# Patient Record
Sex: Male | Born: 1935 | Race: White | Hispanic: No | Marital: Married | Smoking: Former smoker
Health system: Southern US, Community
[De-identification: ages and names within clinical notes are randomized; demographics above are authoritative.]

## PROBLEM LIST (undated history)

## (undated) DIAGNOSIS — Q211 Atrial septal defect: Secondary | ICD-10-CM

## (undated) DIAGNOSIS — I2510 Atherosclerotic heart disease of native coronary artery without angina pectoris: Secondary | ICD-10-CM

## (undated) DIAGNOSIS — E0590 Thyrotoxicosis, unspecified without thyrotoxic crisis or storm: Secondary | ICD-10-CM

## (undated) DIAGNOSIS — I513 Intracardiac thrombosis, not elsewhere classified: Secondary | ICD-10-CM

## (undated) DIAGNOSIS — F419 Anxiety disorder, unspecified: Secondary | ICD-10-CM

## (undated) DIAGNOSIS — Q2112 Patent foramen ovale: Secondary | ICD-10-CM

## (undated) DIAGNOSIS — E119 Type 2 diabetes mellitus without complications: Secondary | ICD-10-CM

## (undated) DIAGNOSIS — I472 Ventricular tachycardia, unspecified: Secondary | ICD-10-CM

## (undated) DIAGNOSIS — I493 Ventricular premature depolarization: Secondary | ICD-10-CM

## (undated) DIAGNOSIS — C679 Malignant neoplasm of bladder, unspecified: Secondary | ICD-10-CM

## (undated) DIAGNOSIS — E785 Hyperlipidemia, unspecified: Secondary | ICD-10-CM

## (undated) DIAGNOSIS — I639 Cerebral infarction, unspecified: Secondary | ICD-10-CM

## (undated) DIAGNOSIS — I219 Acute myocardial infarction, unspecified: Secondary | ICD-10-CM

## (undated) DIAGNOSIS — I4891 Unspecified atrial fibrillation: Secondary | ICD-10-CM

## (undated) DIAGNOSIS — I519 Heart disease, unspecified: Secondary | ICD-10-CM

## (undated) DIAGNOSIS — IMO0002 Reserved for concepts with insufficient information to code with codable children: Secondary | ICD-10-CM

## (undated) DIAGNOSIS — N399 Disorder of urinary system, unspecified: Secondary | ICD-10-CM

## (undated) DIAGNOSIS — I10 Essential (primary) hypertension: Secondary | ICD-10-CM

## (undated) DIAGNOSIS — I4819 Other persistent atrial fibrillation: Secondary | ICD-10-CM

## (undated) DIAGNOSIS — R002 Palpitations: Secondary | ICD-10-CM

## (undated) HISTORY — DX: Intracardiac thrombosis, not elsewhere classified: I51.3

## (undated) HISTORY — DX: Ventricular premature depolarization: I49.3

## (undated) HISTORY — DX: Atherosclerotic heart disease of native coronary artery without angina pectoris: I25.10

## (undated) HISTORY — DX: Reserved for concepts with insufficient information to code with codable children: IMO0002

## (undated) HISTORY — PX: PROSTATECTOMY: SHX69

## (undated) HISTORY — DX: Essential (primary) hypertension: I10

## (undated) HISTORY — DX: Palpitations: R00.2

## (undated) HISTORY — PX: CARDIAC CATHETERIZATION: SHX172

## (undated) HISTORY — DX: Malignant neoplasm of bladder, unspecified: C67.9

## (undated) HISTORY — DX: Patent foramen ovale: Q21.12

## (undated) HISTORY — DX: Acute myocardial infarction, unspecified: I21.9

## (undated) HISTORY — DX: Heart disease, unspecified: I51.9

## (undated) HISTORY — DX: Hyperlipidemia, unspecified: E78.5

## (undated) HISTORY — DX: Ventricular tachycardia: I47.2

## (undated) HISTORY — DX: Anxiety disorder, unspecified: F41.9

## (undated) HISTORY — DX: Unspecified atrial fibrillation: I48.91

## (undated) HISTORY — DX: Atrial septal defect: Q21.1

## (undated) HISTORY — DX: Thyrotoxicosis, unspecified without thyrotoxic crisis or storm: E05.90

## (undated) HISTORY — DX: Cerebral infarction, unspecified: I63.9

## (undated) HISTORY — DX: Ventricular tachycardia, unspecified: I47.20

## (undated) HISTORY — DX: Disorder of urinary system, unspecified: N39.9

---
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
## 1990-05-09 HISTORY — PX: CYSTECTOMY: SUR359

## 1990-05-09 HISTORY — PX: NEPHRECTOMY: SHX65

## 1993-05-09 DIAGNOSIS — I219 Acute myocardial infarction, unspecified: Secondary | ICD-10-CM

## 1993-05-09 HISTORY — DX: Acute myocardial infarction, unspecified: I21.9

## 1997-08-28 ENCOUNTER — Ambulatory Visit (HOSPITAL_COMMUNITY): Admission: RE | Admit: 1997-08-28 | Discharge: 1997-08-28 | Payer: Self-pay | Admitting: Urology

## 1997-10-15 ENCOUNTER — Ambulatory Visit (HOSPITAL_COMMUNITY): Admission: RE | Admit: 1997-10-15 | Discharge: 1997-10-15 | Payer: Self-pay | Admitting: Urology

## 1997-10-22 ENCOUNTER — Ambulatory Visit (HOSPITAL_COMMUNITY): Admission: RE | Admit: 1997-10-22 | Discharge: 1997-10-22 | Payer: Self-pay | Admitting: Urology

## 1997-12-10 ENCOUNTER — Ambulatory Visit (HOSPITAL_COMMUNITY): Admission: RE | Admit: 1997-12-10 | Discharge: 1997-12-10 | Payer: Self-pay | Admitting: Gastroenterology

## 1998-05-09 HISTORY — PX: EYE SURGERY: SHX253

## 1998-06-09 ENCOUNTER — Ambulatory Visit (HOSPITAL_COMMUNITY): Admission: RE | Admit: 1998-06-09 | Discharge: 1998-06-09 | Payer: Self-pay | Admitting: Ophthalmology

## 1998-12-08 ENCOUNTER — Ambulatory Visit (HOSPITAL_COMMUNITY): Admission: RE | Admit: 1998-12-08 | Discharge: 1998-12-08 | Payer: Self-pay | Admitting: Ophthalmology

## 2000-12-05 ENCOUNTER — Ambulatory Visit (HOSPITAL_COMMUNITY): Admission: RE | Admit: 2000-12-05 | Discharge: 2000-12-05 | Payer: Self-pay | Admitting: Gastroenterology

## 2000-12-05 ENCOUNTER — Encounter (INDEPENDENT_AMBULATORY_CARE_PROVIDER_SITE_OTHER): Payer: Self-pay | Admitting: Specialist

## 2002-02-08 ENCOUNTER — Encounter: Admission: RE | Admit: 2002-02-08 | Discharge: 2002-02-08 | Payer: Self-pay | Admitting: Urology

## 2002-02-08 ENCOUNTER — Encounter: Payer: Self-pay | Admitting: Urology

## 2002-02-12 ENCOUNTER — Encounter (INDEPENDENT_AMBULATORY_CARE_PROVIDER_SITE_OTHER): Payer: Self-pay

## 2002-02-12 ENCOUNTER — Ambulatory Visit (HOSPITAL_BASED_OUTPATIENT_CLINIC_OR_DEPARTMENT_OTHER): Admission: RE | Admit: 2002-02-12 | Discharge: 2002-02-12 | Payer: Self-pay | Admitting: Urology

## 2002-05-06 ENCOUNTER — Emergency Department (HOSPITAL_COMMUNITY): Admission: EM | Admit: 2002-05-06 | Discharge: 2002-05-07 | Payer: Self-pay | Admitting: Physical Therapy

## 2002-09-06 ENCOUNTER — Ambulatory Visit (HOSPITAL_BASED_OUTPATIENT_CLINIC_OR_DEPARTMENT_OTHER): Admission: RE | Admit: 2002-09-06 | Discharge: 2002-09-06 | Payer: Self-pay | Admitting: Urology

## 2002-09-06 ENCOUNTER — Encounter (INDEPENDENT_AMBULATORY_CARE_PROVIDER_SITE_OTHER): Payer: Self-pay | Admitting: *Deleted

## 2004-05-09 DIAGNOSIS — I639 Cerebral infarction, unspecified: Secondary | ICD-10-CM

## 2004-05-09 HISTORY — DX: Cerebral infarction, unspecified: I63.9

## 2004-08-18 ENCOUNTER — Inpatient Hospital Stay (HOSPITAL_COMMUNITY): Admission: EM | Admit: 2004-08-18 | Discharge: 2004-08-22 | Payer: Self-pay | Admitting: Emergency Medicine

## 2004-08-19 ENCOUNTER — Encounter (INDEPENDENT_AMBULATORY_CARE_PROVIDER_SITE_OTHER): Payer: Self-pay | Admitting: *Deleted

## 2005-05-23 ENCOUNTER — Ambulatory Visit: Payer: Self-pay | Admitting: Internal Medicine

## 2005-05-24 ENCOUNTER — Encounter (INDEPENDENT_AMBULATORY_CARE_PROVIDER_SITE_OTHER): Payer: Self-pay | Admitting: *Deleted

## 2005-05-24 ENCOUNTER — Inpatient Hospital Stay (HOSPITAL_COMMUNITY): Admission: EM | Admit: 2005-05-24 | Discharge: 2005-05-27 | Payer: Self-pay | Admitting: Emergency Medicine

## 2005-05-25 HISTORY — PX: CARDIAC DEFIBRILLATOR PLACEMENT: SHX171

## 2005-06-08 ENCOUNTER — Ambulatory Visit: Payer: Self-pay

## 2005-07-19 ENCOUNTER — Inpatient Hospital Stay (HOSPITAL_COMMUNITY): Admission: RE | Admit: 2005-07-19 | Discharge: 2005-07-21 | Payer: Self-pay | Admitting: Urology

## 2005-07-20 ENCOUNTER — Encounter (INDEPENDENT_AMBULATORY_CARE_PROVIDER_SITE_OTHER): Payer: Self-pay | Admitting: *Deleted

## 2005-09-17 ENCOUNTER — Inpatient Hospital Stay (HOSPITAL_COMMUNITY): Admission: EM | Admit: 2005-09-17 | Discharge: 2005-09-29 | Payer: Self-pay | Admitting: Emergency Medicine

## 2005-09-17 ENCOUNTER — Ambulatory Visit: Payer: Self-pay | Admitting: Internal Medicine

## 2005-09-19 ENCOUNTER — Encounter (INDEPENDENT_AMBULATORY_CARE_PROVIDER_SITE_OTHER): Payer: Self-pay | Admitting: *Deleted

## 2005-10-26 ENCOUNTER — Ambulatory Visit: Payer: Self-pay | Admitting: Internal Medicine

## 2005-11-13 ENCOUNTER — Emergency Department (HOSPITAL_COMMUNITY): Admission: EM | Admit: 2005-11-13 | Discharge: 2005-11-13 | Payer: Self-pay | Admitting: *Deleted

## 2005-11-29 ENCOUNTER — Inpatient Hospital Stay (HOSPITAL_COMMUNITY): Admission: RE | Admit: 2005-11-29 | Discharge: 2005-11-30 | Payer: Self-pay | Admitting: Urology

## 2005-11-30 ENCOUNTER — Encounter (INDEPENDENT_AMBULATORY_CARE_PROVIDER_SITE_OTHER): Payer: Self-pay | Admitting: *Deleted

## 2008-05-14 ENCOUNTER — Emergency Department (HOSPITAL_COMMUNITY): Admission: EM | Admit: 2008-05-14 | Discharge: 2008-05-14 | Payer: Self-pay | Admitting: Emergency Medicine

## 2008-06-16 ENCOUNTER — Inpatient Hospital Stay (HOSPITAL_COMMUNITY): Admission: AD | Admit: 2008-06-16 | Discharge: 2008-06-19 | Payer: Self-pay | Admitting: *Deleted

## 2008-06-23 ENCOUNTER — Encounter: Admission: RE | Admit: 2008-06-23 | Discharge: 2008-06-23 | Payer: Self-pay | Admitting: Internal Medicine

## 2009-08-19 LAB — ICD DEVICE OBSERVATION

## 2009-12-29 ENCOUNTER — Ambulatory Visit: Payer: Self-pay | Admitting: *Deleted

## 2010-01-19 ENCOUNTER — Ambulatory Visit: Payer: Self-pay | Admitting: Cardiology

## 2010-01-19 LAB — POCT INR: INR: 2.3

## 2010-02-01 ENCOUNTER — Ambulatory Visit: Payer: Self-pay | Admitting: Cardiology

## 2010-02-01 ENCOUNTER — Ambulatory Visit (HOSPITAL_COMMUNITY): Admission: RE | Admit: 2010-02-01 | Discharge: 2010-02-01 | Payer: Self-pay | Admitting: Cardiology

## 2010-02-10 ENCOUNTER — Encounter: Admission: RE | Admit: 2010-02-10 | Discharge: 2010-02-10 | Payer: Self-pay | Admitting: Cardiology

## 2010-03-01 ENCOUNTER — Ambulatory Visit: Payer: Self-pay | Admitting: Cardiology

## 2010-03-02 ENCOUNTER — Encounter: Payer: Self-pay | Admitting: Internal Medicine

## 2010-03-18 ENCOUNTER — Ambulatory Visit: Payer: Self-pay | Admitting: Internal Medicine

## 2010-03-29 ENCOUNTER — Ambulatory Visit: Payer: Self-pay | Admitting: Cardiology

## 2010-04-14 ENCOUNTER — Ambulatory Visit: Payer: Self-pay | Admitting: Cardiology

## 2010-05-13 ENCOUNTER — Ambulatory Visit: Payer: Self-pay | Admitting: Cardiology

## 2010-06-09 ENCOUNTER — Other Ambulatory Visit (INDEPENDENT_AMBULATORY_CARE_PROVIDER_SITE_OTHER): Payer: Medicare Other

## 2010-06-09 DIAGNOSIS — Z7901 Long term (current) use of anticoagulants: Secondary | ICD-10-CM

## 2010-06-10 NOTE — Miscellaneous (Signed)
Summary: Device preload  Clinical Lists Changes  Observations: Added new observation of ICD INDICATN: VT (03/02/2010 16:17) Added new observation of ICDLEADSTAT1: active (03/02/2010 16:17) Added new observation of ICDLEADSER1: [id number] (03/02/2010 16:17) Added new observation of ICDLEADMOD1: 7001  (03/02/2010 16:17) Added new observation of ICDLEADDOI1: 05/25/2005  (03/02/2010 16:17) Added new observation of ICDLEADLOC1: RV  (03/02/2010 16:17) Added new observation of ICD IMP MD: Lewayne Bunting, MD  (03/02/2010 16:17) Added new observation of ICD IMPL DTE: 05/25/2005  (03/02/2010 16:17) Added new observation of ICD MODL#: V168  (03/02/2010 16:17) Added new observation of ICDMANUFACTR: [employer] Jude  (03/02/2010 16:17) Added new observation of IDC REFER MD: Gloris Ham Tennant,MD  (03/02/2010 16:17) Added new observation of ICD MD: Lewayne Bunting, MD  (03/02/2010 16:17)       ICD Specifications Following MD:  Lewayne Bunting, MD     Referring MD:  Burna Forts ICD Vendor:  [employer] Jude     ICD Model Number:  [phone]     ICD DOI:  05/25/2005     ICD Implanting MD:  Lewayne Bunting, MD  Lead 1:    Location: RV     DOI: 05/25/2005     Model #: [id number]     Serial #: [id number]     Status: active  Indications::  VT

## 2010-06-10 NOTE — Procedures (Signed)
Summary: DEFIB CK   Current Medications (verified): 1)  Amlodipine Besylate 5 Mg Tabs (Amlodipine Besylate) .... One By Mouth Two Times A Day 2)  Aspir-Low 81 Mg Tbec (Aspirin) .... One By Mouth Daily 3)  Furosemide 40 Mg Tabs (Furosemide) .... One By Mouth Daily 4)  Lisinopril 20 Mg Tabs (Lisinopril) .... One By Mouth Daily 5)  Warfarin Sodium 5 Mg Tabs (Warfarin Sodium) .... As Directed 6)  Sorine 160 Mg Tabs (Sotalol Hcl) .... One By Mouth Two Times A Day 7)  Simvastatin 40 Mg Tabs (Simvastatin) .... One By Mouth Daily  Allergies (verified): 1)  ! Penicillin   ICD Specifications Following MD:  Lewayne Bunting, MD     Referring MD:  Burna Forts ICD Vendor:  [facility] Jude     ICD Model Number:  [id number]     ICD DOI:  05/25/2005     ICD Implanting MD:  Lewayne Bunting, MD  Lead 1:    Location: RV     DOI: 05/25/2005     Model #: [id number]     Serial #: [id number]     Status: active  Indications::  VT   ICD Follow Up Remote Check?  No Battery Voltage:  2.61 V     Charge Time:  11.8 seconds     Underlying rhythm:  SR ICD Dependent:  No       ICD Device Measurements Right Ventricle:  Amplitude: 12 mV, Impedance: 550 ohms, Threshold: 0.5 V at 0.5 msec  Episodes Coumadin:  Yes Shock:  0     ATP:  2     Nonsustained:  0     Ventricular Pacing:  <1%  Brady Parameters Mode VVI     Lower Rate Limit:  45      Tachy Zones VF:  240     VT:  190     VT1:  130     Next Cardiology Appt Due:  06/09/2010 Tech Comments:  No pararmeter changes.  Device function normal.  1 VT episode ATP x2 successful 05/01/2009.  ROV 3months with Dr. Ladona Ridgel. Altha Harm, LPN  March 18, 2010 11:45 AM  +

## 2010-06-12 ENCOUNTER — Encounter: Payer: Self-pay | Admitting: Cardiology

## 2010-06-12 DIAGNOSIS — I472 Ventricular tachycardia: Secondary | ICD-10-CM | POA: Insufficient documentation

## 2010-06-12 DIAGNOSIS — I10 Essential (primary) hypertension: Secondary | ICD-10-CM | POA: Insufficient documentation

## 2010-06-12 DIAGNOSIS — N399 Disorder of urinary system, unspecified: Secondary | ICD-10-CM | POA: Insufficient documentation

## 2010-06-12 DIAGNOSIS — F419 Anxiety disorder, unspecified: Secondary | ICD-10-CM | POA: Insufficient documentation

## 2010-06-12 DIAGNOSIS — I4819 Other persistent atrial fibrillation: Secondary | ICD-10-CM | POA: Insufficient documentation

## 2010-06-12 DIAGNOSIS — E785 Hyperlipidemia, unspecified: Secondary | ICD-10-CM | POA: Insufficient documentation

## 2010-06-12 DIAGNOSIS — I219 Acute myocardial infarction, unspecified: Secondary | ICD-10-CM | POA: Insufficient documentation

## 2010-06-12 DIAGNOSIS — I2510 Atherosclerotic heart disease of native coronary artery without angina pectoris: Secondary | ICD-10-CM | POA: Insufficient documentation

## 2010-06-12 DIAGNOSIS — R002 Palpitations: Secondary | ICD-10-CM | POA: Insufficient documentation

## 2010-06-12 DIAGNOSIS — C679 Malignant neoplasm of bladder, unspecified: Secondary | ICD-10-CM | POA: Insufficient documentation

## 2010-06-12 DIAGNOSIS — E0590 Thyrotoxicosis, unspecified without thyrotoxic crisis or storm: Secondary | ICD-10-CM | POA: Insufficient documentation

## 2010-06-12 DIAGNOSIS — I493 Ventricular premature depolarization: Secondary | ICD-10-CM | POA: Insufficient documentation

## 2010-06-12 DIAGNOSIS — IMO0002 Reserved for concepts with insufficient information to code with codable children: Secondary | ICD-10-CM | POA: Insufficient documentation

## 2010-06-12 DIAGNOSIS — I513 Intracardiac thrombosis, not elsewhere classified: Secondary | ICD-10-CM | POA: Insufficient documentation

## 2010-06-12 DIAGNOSIS — Q211 Atrial septal defect: Secondary | ICD-10-CM | POA: Insufficient documentation

## 2010-06-28 DIAGNOSIS — I2510 Atherosclerotic heart disease of native coronary artery without angina pectoris: Secondary | ICD-10-CM | POA: Insufficient documentation

## 2010-06-28 DIAGNOSIS — Z95810 Presence of automatic (implantable) cardiac defibrillator: Secondary | ICD-10-CM | POA: Insufficient documentation

## 2010-06-28 DIAGNOSIS — E0590 Thyrotoxicosis, unspecified without thyrotoxic crisis or storm: Secondary | ICD-10-CM | POA: Insufficient documentation

## 2010-06-29 ENCOUNTER — Encounter (INDEPENDENT_AMBULATORY_CARE_PROVIDER_SITE_OTHER): Payer: Medicare Other | Admitting: Internal Medicine

## 2010-06-29 ENCOUNTER — Encounter: Payer: Self-pay | Admitting: Internal Medicine

## 2010-06-29 DIAGNOSIS — I472 Ventricular tachycardia, unspecified: Secondary | ICD-10-CM | POA: Insufficient documentation

## 2010-06-29 DIAGNOSIS — I2589 Other forms of chronic ischemic heart disease: Secondary | ICD-10-CM

## 2010-07-06 NOTE — Assessment & Plan Note (Signed)
Summary: pc2   Visit Type:  follow-up Primary Provider:  ehinger   History of Present Illness: [name] [name] returns today for followup to re-establish ICD followup. He has a h/o VT, and is s/p anterior MI. He has a h/o atrial fib. His ICD was implanted in 2007 after developing sustained VT. He had been followed by Dr. Reyes Ivan but now presents for ongoing evaluation. In the interim he has been well and denies recurrent ICD shocks. No c/p or sob. No peripheral edema.    Current Medications (verified): 1)  Amlodipine Besylate 5 Mg Tabs (Amlodipine Besylate) .... One By Mouth Two Times A Day 2)  Aspir-Low 81 Mg Tbec (Aspirin) .... One By Mouth Daily 3)  Furosemide 40 Mg Tabs (Furosemide) .... One By Mouth Daily 4)  Lisinopril 20 Mg Tabs (Lisinopril) .... One By Mouth Daily 5)  Warfarin Sodium 5 Mg Tabs (Warfarin Sodium) .... As Directed 6)  Sorine 160 Mg Tabs (Sotalol Hcl) .... One By Mouth Two Times A Day 7)  Simvastatin 40 Mg Tabs (Simvastatin) .... One By Mouth Daily  Allergies: 1)  ! Penicillin  Past History:  Past Medical History: Last updated: 06/28/2010 Current Problems:  IMPLANTATION OF DEFIBRILLATOR, HX OF (ICD-V45.02) LEFT VENTRICULAR FUNCTION, DECREASED (ICD-429.2) CAD (ICD-414.00) HYPERTHYROIDISM (ICD-242.90) ATRIAL FIBRILLATION (ICD-427.31)    Review of Systems  The patient denies chest pain, syncope, dyspnea on exertion, and peripheral edema.    Vital Signs:  Patient profile:   77 year old male Height:      73 inches Weight:      227 pounds BMI:     30.06 Pulse rate:   64 / minute BP sitting:   154 / 70  (left arm)  Vitals Entered By: Laurance Flatten CMA (June 29, 2010 1:49 PM)  Physical Exam  General:  Well developed, well nourished, in no acute distress.  HEENT: normal Neck: supple. No JVD. Carotids 2+ bilaterally no bruits Cor: RRR no rubs, gallops or murmur Lungs: CTA. Well healed ICD incision. Ab: soft, nontender. nondistended. No HSM. Good bowel  sounds Ext: warm. no cyanosis, clubbing or edema Neuro: alert and oriented. Grossly nonfocal. affect pleasant      ICD Specifications Following MD:  Lewayne Bunting, MD     Referring MD:  Burna Forts ICD Vendor:  [employer] Jude     ICD Model Number:  [phone]     ICD DOI:  05/25/2005     ICD Implanting MD:  Lewayne Bunting, MD  Lead 1:    Location: RV     DOI: 05/25/2005     Model #: 7001     Serial #: [id number]     Status: active  Indications::  VT   ICD Follow Up ICD Dependent:  No      Episodes Coumadin:  Yes  Brady Parameters Mode VVI     Lower Rate Limit:  45      Tachy Zones VF:  240     VT:  190     VT1:  130     MD Comments:  Normal device function.  Impression & Recommendations:  Problem # 1:  IMPLANTATION OF DEFIBRILLATOR, HX OF (ICD-V45.02) His device is working normally. Will follow.  Problem # 2:  ATRIAL FIBRILLATION (ICD-427.31) He appears to be maintaining NSR. Will continue sotalol. His updated medication list for this problem includes:    Aspir-low 81 Mg Tbec (Aspirin) ..... One by mouth daily    Warfarin Sodium 5 Mg Tabs (Warfarin sodium) .Marland KitchenMarland KitchenMarland KitchenMarland Kitchen  As directed    Sorine 160 Mg Tabs (Sotalol hcl) ..... One by mouth two times a day  Problem # 3:  VENTRICULAR TACHYCARDIA, PAROXYSMAL (ICD-427.1) His VT is well controlled on sotalol. Will continue current meds. His updated medication list for this problem includes:    Amlodipine Besylate 5 Mg Tabs (Amlodipine besylate) ..... One by mouth two times a day    Aspir-low 81 Mg Tbec (Aspirin) ..... One by mouth daily    Lisinopril 20 Mg Tabs (Lisinopril) ..... One by mouth daily    Warfarin Sodium 5 Mg Tabs (Warfarin sodium) .Marland Kitchen... As directed    Sorine 160 Mg Tabs (Sotalol hcl) ..... One by mouth two times a day  Patient Instructions: 1)  Your physician wants you to follow-up in: 3 months in the device clinic and 12 months with Dr Court Joy will receive a reminder letter in the mail two months in advance. If you don't  receive a letter, please call our office to schedule the follow-up appointment. 2)  Your physician recommends that you continue on your current medications as directed. Please refer to the Current Medication list given to you today.

## 2010-07-06 NOTE — Cardiovascular Report (Signed)
Summary: Office Visit   Office Visit   Imported By: Roderic Ovens 06/30/2010 15:58:26  _____________________________________________________________________  External Attachment:    Type:   Image     Comment:   External Document

## 2010-07-07 ENCOUNTER — Other Ambulatory Visit (INDEPENDENT_AMBULATORY_CARE_PROVIDER_SITE_OTHER): Payer: Medicare Other

## 2010-07-07 DIAGNOSIS — Z7901 Long term (current) use of anticoagulants: Secondary | ICD-10-CM

## 2010-07-07 DIAGNOSIS — I472 Ventricular tachycardia: Secondary | ICD-10-CM

## 2010-07-21 ENCOUNTER — Encounter (INDEPENDENT_AMBULATORY_CARE_PROVIDER_SITE_OTHER): Payer: Medicare Other

## 2010-07-21 DIAGNOSIS — Z7901 Long term (current) use of anticoagulants: Secondary | ICD-10-CM

## 2010-07-21 DIAGNOSIS — I4891 Unspecified atrial fibrillation: Secondary | ICD-10-CM

## 2010-08-02 ENCOUNTER — Ambulatory Visit (INDEPENDENT_AMBULATORY_CARE_PROVIDER_SITE_OTHER): Payer: Medicare Other | Admitting: Cardiology

## 2010-08-02 ENCOUNTER — Encounter: Payer: Self-pay | Admitting: Cardiology

## 2010-08-02 VITALS — BP 170/80 | HR 56 | Ht 73.0 in | Wt 226.0 lb

## 2010-08-02 DIAGNOSIS — I513 Intracardiac thrombosis, not elsewhere classified: Secondary | ICD-10-CM

## 2010-08-02 DIAGNOSIS — I2510 Atherosclerotic heart disease of native coronary artery without angina pectoris: Secondary | ICD-10-CM

## 2010-08-02 DIAGNOSIS — I10 Essential (primary) hypertension: Secondary | ICD-10-CM

## 2010-08-02 DIAGNOSIS — Z7901 Long term (current) use of anticoagulants: Secondary | ICD-10-CM

## 2010-08-02 DIAGNOSIS — I219 Acute myocardial infarction, unspecified: Secondary | ICD-10-CM

## 2010-08-02 LAB — POCT INR: INR: 3.4

## 2010-08-02 NOTE — Progress Notes (Signed)
Subjective:   [name] [name] comes in today for followup visit. In general, he's been doing reasonably well. 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  He's had previous cerebrovascular accident felt to be related to mural thrombus. He has only minimal residual right hand weakness from that event.  His last followup stress test was in June of 2009. 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  Current Outpatient Prescriptions  Medication Sig Dispense Refill  . amLODipine (NORVASC) 5 MG tablet Take 5 mg by mouth 2 (two) times daily.        Marland Kitchen aspirin 81 MG tablet Take 81 mg by mouth daily.        Marland Kitchen desoximetasone (TOPICORT) 0.05 % cream Apply topically as needed.        . DiphenhydrAMINE HCl (DIPHEN PO) Take by mouth as needed.        . furosemide (LASIX) 40 MG tablet Take 40 mg by mouth daily.        Marland Kitchen lisinopril (PRINIVIL,ZESTRIL) 20 MG tablet Take 20 mg by mouth daily.        . simvastatin (ZOCOR) 40 MG tablet Take 40 mg by mouth daily.        . sotalol (BETAPACE) 160 MG tablet Take 160 mg by mouth 2 (two) times daily.        Marland Kitchen warfarin (COUMADIN) 5 MG tablet Take 5 mg by mouth daily. AS DIRECTED.  2.5MG  FOUR DAYS A WEEK 5MG  THREE TIMES A WEEK       . fluticasone (FLONASE) 50 MCG/ACT nasal spray 1 spray by Nasal route as needed.          Allergies  Allergen Reactions  . Penicillins     Patient Active Problem List  Diagnoses  . PVCs (premature ventricular contractions)  . Heart palpitations  . Coronary artery disease  . Hypertension  . Atrial fibrillation  . Anxiety  . Myocardial infarction  . Ventricular tachycardia  . Bladder cancer  . Hyperthyroidism  . Patent foramen ovale  . Mural thrombus of heart  .  Genitourinary disease  . Hyperlipidemia  . HYPERTHYROIDISM  . CAD  . VENTRICULAR TACHYCARDIA, PAROXYSMAL  . LEFT VENTRICULAR FUNCTION, DECREASED  . IMPLANTATION OF DEFIBRILLATOR, HX OF    History  Smoking status  . Former Smoker  . Quit date: 05/09/1990  Smokeless tobacco  . Not on file    History  Alcohol Use No    Family History  Problem Relation Age of Onset  . Heart attack Father     Review of Systems:   The patient denies any heat or cold intolerance.  No weight gain or weight loss.  The patient denies headaches or blurry vision.  There is no cough or sputum production.  The patient denies dizziness.  There is no hematuria or hematochezia.  The patient denies any muscle aches or arthritis.  The patient denies any rash.  The patient denies frequent falling or instability.  There is no history of depression or anxiety.  All other systems were reviewed and are negative.   Physical Exam:  Weight is 226. Vital signs are reviewed. The head is normocephalic and atraumatic.  Pupils are equally round and reactive to light.  Sclerae nonicteric.  Conjunctiva is clear.  Oropharynx is unremarkable.  There's adequate oral airway.  Neck is supple there are no masses.  Thyroid is not enlarged.  There is no lymphadenopathy.  Lungs are clear.  Chest is symmetric.  Heart shows a regular rate and rhythm.  S1 and S2 are normal.  There is no murmur click or gallop.  Abdomen is soft normal bowel sounds.  There is no organomegaly.  Genital and rectal deferred.  Extremities are without edema.  Peripheral pulses are adequate.  Neurologically intact.  Full range of motion.  The patient is not depressed.  Skin is warm and dry.   Assessment / Plan:

## 2010-08-02 NOTE — Assessment & Plan Note (Signed)
His blood pressure is elevated today. We'll have him check home blood pressure readings and bring those in when we see him again in 3 weeks for lab work and repeat INR

## 2010-08-02 NOTE — Assessment & Plan Note (Signed)
Overall, he's doing reasonably well. He had a remote anterior myocardial infarction in 1995 with resultant ejection fraction of 36%. He did not have any early revascularization. He's had a history of ventricular tachycardia and had an ICD implanted in early 2007 for sustained ventricular tachycardia. In spring of 2007, he had multiple discharges from his ICD. 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 He's been maintained on sotalol. He was intolerant of multi-. He's been on sotalol for his atrial fibrillation since February 2010. Overall, from a cardiac standpoint he's been doing well. His last stress test was in June of 2009

## 2010-08-02 NOTE — Assessment & Plan Note (Signed)
Is on warfarin anticoagulation. He did have cerebrovascular accident felt to be related to this apical mural thrombus in 2006.

## 2010-08-23 ENCOUNTER — Other Ambulatory Visit: Payer: Self-pay | Admitting: *Deleted

## 2010-08-23 DIAGNOSIS — E7800 Pure hypercholesterolemia, unspecified: Secondary | ICD-10-CM

## 2010-08-23 DIAGNOSIS — Z79899 Other long term (current) drug therapy: Secondary | ICD-10-CM

## 2010-08-23 DIAGNOSIS — R0602 Shortness of breath: Secondary | ICD-10-CM

## 2010-08-23 LAB — COMPREHENSIVE METABOLIC PANEL
ALT: 19 U/L (ref 0–53)
CO2: 23 mEq/L (ref 19–32)
GFR calc Af Amer: >60 mL/min (ref >60)
Glucose, Bld: 169 mg/dL — ABNORMAL HIGH (ref 70–99)
Potassium: 4.3 mEq/L (ref 3.5–5.1)
Sodium: 139 mEq/L (ref 135–145)
Total Bilirubin: 0.6 mg/dL (ref 0.3–1.2)

## 2010-08-23 LAB — DIFFERENTIAL
Eosinophils Absolute: 0 10*3/uL (ref 0.0–0.7)
Eosinophils Relative: 0 % (ref 0–5)
Lymphocytes Relative: 10 % — ABNORMAL LOW (ref 12–46)
Lymphs Abs: 0.9 10*3/uL (ref 0.7–4.0)
Monocytes Absolute: 0.3 10*3/uL (ref 0.1–1.0)
Monocytes Relative: 3 % (ref 3–12)
Neutro Abs: 7.3 10*3/uL (ref 1.7–7.7)
Neutrophils Relative %: 86 % — ABNORMAL HIGH (ref 43–77)

## 2010-08-23 LAB — CBC: RBC: 4.51 MIL/uL (ref 4.22–5.81)

## 2010-08-23 LAB — URINALYSIS, ROUTINE W REFLEX MICROSCOPIC
Glucose, UA: 250 mg/dL — AB
Ketones, ur: NEGATIVE mg/dL
Specific Gravity, Urine: 1.013 (ref 1.005–1.030)
pH: 7 (ref 5.0–8.0)

## 2010-08-23 LAB — URINE MICROSCOPIC-ADD ON

## 2010-08-23 LAB — POCT CARDIAC MARKERS: Troponin i, poc: <0.05 ng/mL (ref 0.00–0.09)

## 2010-08-23 LAB — PROTIME-INR: INR: 2.5 — ABNORMAL HIGH (ref 0.00–1.49)

## 2010-08-24 LAB — BASIC METABOLIC PANEL
BUN: 13 mg/dL (ref 6–23)
BUN: 14 mg/dL (ref 6–23)
CO2: 26 mEq/L (ref 19–32)
CO2: 28 mEq/L (ref 19–32)
Calcium: 9.2 mg/dL (ref 8.4–10.5)
Chloride: 102 mEq/L (ref 96–112)
Chloride: 104 mEq/L (ref 96–112)
Creatinine, Ser: 1.15 mg/dL (ref 0.4–1.5)
GFR calc Af Amer: >60 mL/min (ref >60)
GFR calc non Af Amer: 57 mL/min — ABNORMAL LOW (ref >60)
GFR calc non Af Amer: >60 mL/min (ref >60)
Glucose, Bld: 117 mg/dL — ABNORMAL HIGH (ref 70–99)
Glucose, Bld: 120 mg/dL — ABNORMAL HIGH (ref 70–99)
Potassium: 3.7 mEq/L (ref 3.5–5.1)
Potassium: 4 mEq/L (ref 3.5–5.1)
Sodium: 137 mEq/L (ref 135–145)

## 2010-08-24 LAB — COMPREHENSIVE METABOLIC PANEL
Albumin: 3.2 g/dL — ABNORMAL LOW (ref 3.5–5.2)
BUN: 15 mg/dL (ref 6–23)
Calcium: 9.1 mg/dL (ref 8.4–10.5)
Glucose, Bld: 210 mg/dL — ABNORMAL HIGH (ref 70–99)
Sodium: 137 mEq/L (ref 135–145)
Total Protein: 6.3 g/dL (ref 6.0–8.3)

## 2010-08-24 LAB — CBC
HCT: 38.3 % — ABNORMAL LOW (ref 39.0–52.0)
HCT: 40.1 % (ref 39.0–52.0)
Hemoglobin: 12.9 g/dL — ABNORMAL LOW (ref 13.0–17.0)
Hemoglobin: 13.7 g/dL (ref 13.0–17.0)
MCHC: 33.7 g/dL (ref 30.0–36.0)
Platelets: 180 10*3/uL (ref 150–400)
RDW: 13.3 % (ref 11.5–15.5)
RDW: 13.6 % (ref 11.5–15.5)

## 2010-08-24 LAB — MAGNESIUM
Magnesium: 2.3 mg/dL (ref 1.5–2.5)
Magnesium: 2.3 mg/dL (ref 1.5–2.5)

## 2010-08-24 LAB — PROTIME-INR
Prothrombin Time: 24 seconds — ABNORMAL HIGH (ref 11.6–15.2)
Prothrombin Time: 25.3 seconds — ABNORMAL HIGH (ref 11.6–15.2)

## 2010-08-24 LAB — APTT: aPTT: 46 seconds — ABNORMAL HIGH (ref 24–37)

## 2010-08-24 LAB — DIFFERENTIAL
Basophils Absolute: 0 10*3/uL (ref 0.0–0.1)
Eosinophils Relative: 2 % (ref 0–5)
Lymphocytes Relative: 17 % (ref 12–46)
Lymphs Abs: 1.1 10*3/uL (ref 0.7–4.0)
Monocytes Absolute: 0.6 10*3/uL (ref 0.1–1.0)

## 2010-08-24 LAB — TSH: TSH: 0.011 u[IU]/mL — ABNORMAL LOW (ref 0.350–4.500)

## 2010-08-25 ENCOUNTER — Other Ambulatory Visit (INDEPENDENT_AMBULATORY_CARE_PROVIDER_SITE_OTHER): Payer: Medicare Other | Admitting: *Deleted

## 2010-08-25 ENCOUNTER — Ambulatory Visit (INDEPENDENT_AMBULATORY_CARE_PROVIDER_SITE_OTHER): Payer: Medicare Other | Admitting: *Deleted

## 2010-08-25 DIAGNOSIS — Z79899 Other long term (current) drug therapy: Secondary | ICD-10-CM

## 2010-08-25 DIAGNOSIS — I4891 Unspecified atrial fibrillation: Secondary | ICD-10-CM

## 2010-08-25 DIAGNOSIS — E7800 Pure hypercholesterolemia, unspecified: Secondary | ICD-10-CM

## 2010-08-25 DIAGNOSIS — R0602 Shortness of breath: Secondary | ICD-10-CM

## 2010-08-25 LAB — CBC WITH DIFFERENTIAL/PLATELET
Basophils Relative: 0 % (ref 0.0–3.0)
Eosinophils Absolute: 0.2 10*3/uL (ref 0.0–0.7)
Hemoglobin: 14.4 g/dL (ref 13.0–17.0)
MCHC: 33.9 g/dL (ref 30.0–36.0)
MCV: 95.9 fl (ref 78.0–100.0)
Monocytes Absolute: 0.6 10*3/uL (ref 0.1–1.0)
Neutro Abs: 7.4 10*3/uL (ref 1.4–7.7)
RBC: 4.43 Mil/uL (ref 4.22–5.81)

## 2010-08-25 LAB — BASIC METABOLIC PANEL
BUN: 18 mg/dL (ref 6–23)
Chloride: 103 mEq/L (ref 96–112)
Glucose, Bld: 95 mg/dL (ref 70–99)
Potassium: 4.7 mEq/L (ref 3.5–5.1)

## 2010-08-25 LAB — HEPATIC FUNCTION PANEL
ALT: 17 U/L (ref 0–53)
AST: 14 U/L (ref 0–37)
Albumin: 4 g/dL (ref 3.5–5.2)
Total Protein: 7 g/dL (ref 6.0–8.3)

## 2010-08-25 LAB — LIPID PANEL: Cholesterol: 111 mg/dL (ref 0–200)

## 2010-08-30 ENCOUNTER — Telehealth: Payer: Self-pay | Admitting: *Deleted

## 2010-08-30 NOTE — Telephone Encounter (Signed)
Message copied by Barnetta Hammersmith on Mon Aug 30, 2010  8:44 AM ------      Message from: Roger Shelter      Created: Fri Aug 27, 2010  3:59 PM       HDL low, encourage exercise.

## 2010-08-30 NOTE — Telephone Encounter (Signed)
Pt notified of lab work and encouraged to exercise regularly.

## 2010-09-06 ENCOUNTER — Encounter: Payer: Self-pay | Admitting: Cardiology

## 2010-09-06 ENCOUNTER — Ambulatory Visit (INDEPENDENT_AMBULATORY_CARE_PROVIDER_SITE_OTHER): Payer: Medicare Other | Admitting: Cardiology

## 2010-09-06 DIAGNOSIS — I4891 Unspecified atrial fibrillation: Secondary | ICD-10-CM

## 2010-09-06 DIAGNOSIS — I472 Ventricular tachycardia: Secondary | ICD-10-CM

## 2010-09-06 DIAGNOSIS — I219 Acute myocardial infarction, unspecified: Secondary | ICD-10-CM

## 2010-09-06 NOTE — Progress Notes (Signed)
Subjective:   [name] [name] comes in today for followup visit.  Overall, he's been doing well. He brings in list of blood pressure readings that have been satisfactory.  He is caring for his wife with progressive dementia and overall, sounds like he is going excellent job. He has a underlying ejection fraction of approximate 40% with apical scar and an apical thrombus. He is on chronic warfarin anticoagulation. He has a history of ventricular tachycardia and paroxysmal atrial fibrillation. He had an ICD implantation in early 2007 for sustained ventricular tachycardia. He was started on amiodarone at that time. He says will he felt hyperthyroidism and toxicity from amiodarone and was changed to sotalol. In 2007, while on interferon for bladder cancer, he had multiple discharges from his ICD. He is currently on sotalol primarily for atrial fibrillation and that was started in February of 2010.  He has a history of a patent foramen ovale. His apical mural thrombus is committed to long-term warfarin anticoagulation.  He has a history of multiple genitourinary tract cancers followed by Dr. Darvin Neighbours. He had right nephrectomy in 1993. He's had a total prostatectomy for prostate cancer. He's had multiple procedures for recurrent bladder cancer.  He has a history of hypertension and hyperlipemia.  He has a history of a remote cerebrovascular accident in 2006 with some minimal residual right hand weakness. The source of a stroke was felt to be his apical thrombus.  Current Outpatient Prescriptions  Medication Sig Dispense Refill  . amLODipine (NORVASC) 5 MG tablet Take 5 mg by mouth 2 (two) times daily.        Marland Kitchen aspirin 81 MG tablet Take 81 mg by mouth daily.        Marland Kitchen desoximetasone (TOPICORT) 0.05 % cream Apply topically as needed.        . DiphenhydrAMINE HCl (DIPHEN PO) Take by mouth as needed.        . furosemide (LASIX) 40 MG tablet Take 40 mg by mouth daily.        Marland Kitchen lisinopril (PRINIVIL,ZESTRIL) 20 MG  tablet Take 20 mg by mouth daily.        . simvastatin (ZOCOR) 40 MG tablet Take 40 mg by mouth daily.        . sotalol (BETAPACE) 160 MG tablet Take 160 mg by mouth 2 (two) times daily.        Marland Kitchen warfarin (COUMADIN) 5 MG tablet Take 5 mg by mouth daily. AS DIRECTED.  2.5MG  FOUR DAYS A WEEK 5MG  THREE TIMES A WEEK       . fluticasone (FLONASE) 50 MCG/ACT nasal spray 1 spray by Nasal route as needed.          Allergies  Allergen Reactions  . Penicillins     Patient Active Problem List  Diagnoses  . PVCs (premature ventricular contractions)  . Heart palpitations  . Coronary artery disease  . Hypertension  . Atrial fibrillation  . Anxiety  . Myocardial infarction  . Ventricular tachycardia  . Bladder cancer  . Hyperthyroidism  . Patent foramen ovale  . Mural thrombus of heart  . Genitourinary disease  . Hyperlipidemia  . HYPERTHYROIDISM  . CAD  . VENTRICULAR TACHYCARDIA, PAROXYSMAL  . LEFT VENTRICULAR FUNCTION, DECREASED  . IMPLANTATION OF DEFIBRILLATOR, HX OF  . Warfarin anticoagulation    History  Smoking status  . Former Smoker  . Quit date: 05/09/1990  Smokeless tobacco  . Not on file    History  Alcohol Use No  Family History  Problem Relation Age of Onset  . Heart attack Father     Review of Systems:   The patient denies any heat or cold intolerance.  No weight gain or weight loss.  The patient denies headaches or blurry vision.  There is no cough or sputum production.  The patient denies dizziness.  There is no hematuria or hematochezia.  The patient denies any muscle aches or arthritis.  The patient denies any rash.  The patient denies frequent falling or instability.  There is no history of depression or anxiety.  All other systems were reviewed and are negative.   Physical Exam:   Weight is 224. Blood pressure is 152/82 sitting, heart rate 58 and regular.The head is normocephalic and atraumatic.  Pupils are equally round and reactive to light.   Sclerae nonicteric.  Conjunctiva is clear.  Oropharynx is unremarkable.  There's adequate oral airway.  Neck is supple there are no masses.  Thyroid is not enlarged.  There is no lymphadenopathy.  Lungs are clear.  Chest is symmetric.  Heart shows a regular rate and rhythm.  S1 and S2 are normal.  There is no murmur click or gallop.  Abdomen is soft normal bowel sounds.  There is no organomegaly.  Genital and rectal deferred.  Extremities are without edema.  Peripheral pulses are adequate.  Neurologically intact.  Full range of motion.  The patient is not depressed.  Skin is warm and dry.  Assessment / Plan:

## 2010-09-06 NOTE — Progress Notes (Signed)
   Patient ID: [name] [name], male    DOB: 12-09-1929, 77 y.o.   MRN: [id number]  HPI    Review of Systems    Physical Exam

## 2010-09-07 NOTE — Assessment & Plan Note (Signed)
He has an ICD in place for his history of paroxysmal ventricular tachycardia. He is followed by Dr. Sharrell Ku. I will have his general cardiology followup with Dr. Marca Ancona.

## 2010-09-07 NOTE — Assessment & Plan Note (Signed)
Currently in sinus rhythm and is committed to long-term warfarin anticoagulation followed for his atrial fibrillation but also from a history of stroke from his mural thrombus.

## 2010-09-07 NOTE — Assessment & Plan Note (Signed)
His last stress Cardiolite study was in June of 2009. He clinically has been doing well. In followup, it may be reasonable to consider him for repeat Myoview.

## 2010-09-21 NOTE — H&P (Signed)
NAME:  [name] [name], [name] [name]                ACCOUNT NO.:  [id number]   MEDICAL RECORD NO.:  [id number]          PATIENT TYPE:  INP   LOCATION:  2003                         FACILITY:  [facility]   PHYSICIAN:  Elmore Guise., M.D.DATE OF BIRTH:  [birth year]   DATE OF ADMISSION:  06/16/2008  DATE OF DISCHARGE:                              HISTORY & PHYSICAL   INDICATION FOR ADMISSION:  Sotalol loading secondary to atrial  fibrillation.   HISTORY OF PRESENT ILLNESS:  [name] [name] is a very pleasant 77 year old  white male with past medical history of coronary artery disease, LV  dysfunction, CVA, atrial fibrillation, history of ventricular  tachycardia (no recurrence since stopping interferon therapy in spring  of 2007), history of bladder cancer who will be admitted for sotalol  initiation.   SUBJECTIVE:  [name] [name] was diagnosed with atrial fibrillation back in  early January.  He tried a brief treatment of dronedarone but was unable  to tolerate this medication.  He had good rate control just with  metoprolol.  He still feels bad.  He notes his heart skipped.  He  also gets dizzy at times.  He reports some mild dyspnea but no chest  pain.  He has also had some sinus congestion and pressure the last  couple of days but no nausea.  He has done well with his blood pressure  and brings his recordings to the office with blood pressure readings 130-  148 over 60-70.  His heart rate ranged during this time period was 67-85  per minute.   REVIEW OF SYSTEMS:  Are as per HPI.  All others are negative.   CURRENT MEDICATIONS:  1. Are Lasix 40 mg daily.  2. Aspirin 81 mg daily.  3. Toprol 100 mg in the morning, 50 at night.  4. Lisinopril 20 mg daily.  5. Coumadin 5 mg half tablet 5 days a week, full tablet 2 days a week.  6. Amlodipine 5 mg daily.  7. Simvastatin 40 mg daily.   ALLERGIES:  TO PENICILLIN, VANCOMYCIN AND INTERFERON THERAPY.   FAMILY HISTORY:  Noncontributory.   SOCIAL HISTORY:  He is  married.  Denies any tobacco or alcohol.   EXAM:  His weight is 222 pounds, blood pressure 142/80, heart rate is 91  and irregular.  IN GENERAL:  He is a very pleasant white male, alert and oriented x4 in  no acute distress.  HEENT:  Appear unremarkable.  NECK:  Supple.  No lymphadenopathy, 2+ carotids.  No JVD.  LUNGS:  Are clear.  HEART:  Is irregular irregular with normal S1, S2.  ABDOMEN:  Is soft, nontender, nondistended.  No rebound or guarding.  EXTREMITIES:  Warm with trace to 1+ pretibial edema.  SKIN:  Is warm and dry without evidence of rashes or ecchymosis.  NEUROLOGICALLY:  No focal deficits noted.   Blood work today shows magnesium of 2.3, potassium of 3.6,  BUN/creatinine 15 and 1.2.  INR 2.1, hemoglobin 12.9, white blood cell  count of 6.2 and platelet count 180.  ECG shows atrial fibrillation,  left axis deviation and old  anterior lateral MI.   IMPRESSION:  1. Atrial fibrillation.  2. History of coronary artery disease.  3. History of left ventricular dysfunction.  4. History of cerebrovascular accident.  5. Hypertension.   PLAN:  The patient will be started on sotalol 120 mg twice daily.  We  will continue this for 48 hours.  Hopefully we will achieve sinus  rhythm.  However, if he continues to be in atrial fibrillation then  elective cardioversion would be recommended.  He will have potassium  replacement to keep his potassium up to 4.  His magnesium is at goal.  He will also have chest x-ray because of his congestion to make sure no  new abnormalities have arisen.  He will continue his other medicines as  listed.      Elmore Guise., M.D.  Electronically Signed     TWK/MEDQ  D:  06/16/2008  T:  06/16/2008  Job:  [id number]

## 2010-09-21 NOTE — Discharge Summary (Signed)
NAME:  [name] [name], [name] [name]                ACCOUNT NO.:  [id number]   MEDICAL RECORD NO.:  [id number]          PATIENT TYPE:  INP   LOCATION:  2003                         FACILITY:  [facility]   PHYSICIAN:  Elmore Guise., M.D.DATE OF BIRTH:  [birth year]   DATE OF ADMISSION:  06/16/2008  DATE OF DISCHARGE:  06/19/2008                               DISCHARGE SUMMARY   DISCHARGE DIAGNOSES:  1. Atrial fibrillation.  2. Hyperthyroidism with uptake scan consistent with subacute      thyroiditis.  3. History of coronary artery disease.  4. History of mild left ventricular dysfunction.  5. History of CVA.  6. History of ventricular tachycardia status post single chamber      implantable cardioverter-defibrillator implant.   HISTORY OF PRESENT ILLNESS:  Mr. Nickolson is a very pleasant 77 year old  white male who was admitted to the hospital for sotalol initiation.   HOSPITAL COURSE:  The patient's hospital course was uncomplicated.  Routine labs showed a suppressed TSH with elevated T3 and T4.  He then  underwent a 24-hour radioactive iodine uptake, which showed decreased  uptake consistent with subacute thyroiditis.  He tolerated his sotalol  very well.  Because of his newly diagnosed hyperthyroidism, he was also  placed on methimazole 10 mg 2 tablets daily.  We held off on  cardioversion until his thyroid is under better control because of the  increased chance of recurrence of his atrial fibrillation.   DISCHARGE MEDICATIONS:  1. Lasix 40 mg daily.  2. Aspirin 81 mg daily.  3. Lisinopril 20 mg daily.  4. Coumadin.  5. Amlodipine 5 mg daily.  6. Simvastatin 40 mg daily.   He is also to take methimazole 10 mg 2 tablets daily and sotalol 160 mg  twice daily.  He was to stop his Toprol.   His INR was greater than 2 all during his hospitalization.  His  magnesium was 2.3 and potassium of 4.  Blood work on day of discharge  showed a hemoglobin of 13.7, white blood cell count of 6.8, platelet  count of 191, PT/INR of 23.9 and 2.0, potassium of 3.7, a BUN and  creatinine of 17 and 1.2.  His followup appointment will be with Dr.  Sharl Ma at [facility].  Phone number is [phone].  This will be sometime tomorrow for new  patient evaluation.  He will also return for office visit with Dr.  Reyes Ivan at [facility] in 2 weeks.  If he continues to be in  atrial fibrillation, we will discuss outpatient cardioversion at that  time.  He is to call the office if he has any further questions or  concerns.  He should have no restrictions with his activity at this  time.      Elmore Guise., M.D.  Electronically Signed     TWK/MEDQ  D:  06/19/2008  T:  06/20/2008  Job:  [id number]

## 2010-09-22 ENCOUNTER — Ambulatory Visit (INDEPENDENT_AMBULATORY_CARE_PROVIDER_SITE_OTHER): Payer: Medicare Other | Admitting: *Deleted

## 2010-09-22 DIAGNOSIS — I4891 Unspecified atrial fibrillation: Secondary | ICD-10-CM

## 2010-09-22 LAB — POCT INR: INR: 2.3

## 2010-09-24 NOTE — Consult Note (Signed)
NAME:  [name] [name], [name] [name]                ACCOUNT NO.:  [id number]   MEDICAL RECORD NO.:  [id number]          PATIENT TYPE:  INP   LOCATION:  2912                         FACILITY:  [facility]   PHYSICIAN:  Duke Salvia, M.D.  DATE OF BIRTH:  10-03-1929   DATE OF CONSULTATION:  05/24/2005  DATE OF DISCHARGE:                                   CONSULTATION   Thank you very much for asking [place] to see [name] [name] in  electrophysiological consultation for symptomatic wide complex tachycardia.   [name] [name] is a 77 year old gentleman with a history of an MI about 10 years  ago.  This was an anterior MI that left him with a nearly normal left  ventricular function as assessed last spring.  He was, however, noted at  that time to have an apical thrombus following presentation with a stroke  from which he has largely recovered.   He has a long-standing history of PVCs, but no history of symptomatic  persistent tachy palpitations and no history of syncope.   Last evening he had typical palpitations.  He awakened shortly after going  to bed with sustained tachy palpitations.  This was associated with a heart  rate of 160 and a blood pressure in the 80-90 range as determined by his  home blood pressure monitor.  Because of persistence EMS was called and he  was transported to the hospital.  He received lidocaine and amiodarone with  termination of the tachycardia.  We do not currently have a 12-lead of the  tachycardia.   The patient denies prior history.   The patient's functional capacity is quite unlimited without complaints of  chest pain, shortness of breath, or exercise intolerance.   He does have a little bit of peripheral edema, but no nocturnal dyspnea or  orthopnea.   The patient was taken to the catheterization laboratory last night.  Left  ventricular ejection fraction was not assessed.  He did have a 95% lesion in  a small less than 2 mm LAD and it was elected to leave this  alone.   PAST MEDICAL HISTORY:  1.  Hypertension.  2.  Dyslipidemia.  3.  Three malignancies.      1.  History of kidney cancer status post nephrectomy.      2.  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      3.  Prostate cancer, question therapy.   PAST SURGICAL HISTORY:  As noted previously.   MEDICATIONS ON ARRIVAL:  1.  Furosemide once a day.  2.  Lisinopril 20 twice a day.  3.  Metoprolol 75 b.i.d.  4.  Coumadin.   He is allergic to PENICILLIN and some medication that he got last night  during his catheterization.   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  He has three sons.  He is married.  He does not use cigarettes, alcohol, or  recreational drugs.   PHYSICAL EXAMINATION:  GENERAL:  He is an older Caucasian  male appearing his  stated age of 34.  VITAL SIGNS:  Blood pressure 140/70, pulse 65, respirations 16 and  unlabored, O2 saturations are normal.  HEENT:  No icterus.  No xanthomata.  The neck veins were flat.  The carotids  were brisk and full bilaterally without bruits.  BACK:  Without kyphosis, scoliosis.  LUNGS:  Clear.  HEART:  Sounds were regular without murmurs or gallops.  ABDOMEN:  Soft with active bowel sounds without midline pulsation or  hepatomegaly.  EXTREMITIES:  Femoral pulses were 2+.  Distal pulses were intact.  There was  no clubbing, cyanosis, edema.  NEUROLOGIC:  Grossly normal.  SKIN:  Warm and dry.   Rhythm strips are reviewed and demonstrate a wide complex tachycardia with a  cycle length of 340 milliseconds.   Electrocardiogram has currently been requested from EMS.   Baseline electrocardiogram demonstrates sinus rhythm at 92 with intervals of  0.22, 0.1, 0.36.  There is poor R-wave progression across the anterior  precordium.  This electrocardiogram is from April 12.  Electrocardiogram  from this morning demonstrates persistence of anterior  electrocardiographic  changes with minor ST segment depression in the inferior leads and T-wave  inversion in lead L.   IMPRESSION:  1.  Wide complex tachycardia, question mechanism.  2.  History of PVCs.  3.  Ischemic heart disease.      1.  Status post anterior wall myocardial infarction.      2.  Known left ventricular thrombus.      3.  Ejection fraction 55-60% in April of 2006.  4.  Prior stroke, question mechanism attributed to the left ventricular      thrombus.  5.  History of multiple malignancies as noted above including:      1.  Kidney.      2.  Bladder.      3.  Prostate.   [name] [name] has wide complex tachycardia, the mechanism of which is not clear.  We are presuming that it is ventricular given his history of myocardial  infarction but with his normal or previously normal left ventricular  function this, I think, needs to be clearly demonstrated.  Electrophysiological testing may be the only way to do this, although we  will hope to be able to get enough information from his 12-lead from EMS to  clarify this.   In the event that it is SVT, clearly ablation would be a reasonable option.  In the event that it is ventricular tachycardia, consideration could be  given to either medical therapy or ICD therapy for prevention of recurrence;  risks of sudden death would likely be more closely related to the ejection  fraction and this would have an impact on which therapeutic options are most  reasonable.   PLAN:  1.  We will plan then to await the echocardiogram results.  2.  Obtain electrocardiogram from EMS.  3.  Undertake EP testing to clarify the mechanism of the arrhythmia with      further therapeutic options to be defined by that.  It is the patient's      desire, I think, to get all the data prior to making a therapeutic      interventional decision.   Thank you for the consultation.  We will follow with you.          ______________________________  Duke Salvia, M.D.     SCK/MEDQ  D:  05/24/2005  T:  05/24/2005  Job:  [id number]  cc:   Bryan Lemma. Manus Gunning, M.D.  Fax: [phone]   Electrophysiology Lab   [facility]

## 2010-09-24 NOTE — Discharge Summary (Signed)
NAME:  MONTANA, FASSNACHT                ACCOUNT NO.:  [id number]   MEDICAL RECORD NO.:  [id number]          PATIENT TYPE:  INP   LOCATION:  3002                         FACILITY:  [facility]   PHYSICIAN:  Melvyn Novas, M.D.  DATE OF BIRTH:  Jun 21, 1929   DATE OF ADMISSION:  08/18/2004  DATE OF DISCHARGE:                                 DISCHARGE SUMMARY   Mr. Edvardo Honse is a patient of Dr. Shela Leff and was admitted as a  code stroke.  He is a 77 year old male who developed sudden onset of right  upper extremity weakness at 5:30 p.m. by walking in the yard and had also  noticed that his right leg felt heavy and numb at the same time; at about  the same time, shortly later he tried to shave himself and was unable to  move his right arm.  He had still significant grip and hand weakness upon  arrival into the ER.  A code stroke was called.   PAST MEDICAL HISTORY:  Past medical history was positive for hypertension,  hyperlipidemia, remote myocardial infarction, cancer of the prostate, kidney  and bladder.  The patient has a nephrectomy history and prostatectomy  history.   ALLERGIES:  Allergy to Penicillin was noted.   MEDICATIONS:  Zocor, Metoprolol, Prinivil, Furosemide, Plendil.  The patient  states that he cannot tolerate aspirin and that he has multiple times  briefly after taking even enteric coated forms of the drug developed  gastritis and bleeding from the bladder.  He states that his physicians were  aware of his aspirin intolerance and have not addressed alternative  anticoagulation with him.  A non-contrast CT of the head showed no acute  abnormalities and old small left frontal cortical infarct was noted.  The  patient received IV TPA as per protocol and remained in the ICU for 48  hours.  He resolved his problems very well.  Following his TPA, was the next  day a cerebrovascular evaluation by carotid Doppler study which showed no  significant stenosis of the ICA left nor  right.  Vertebral artery flow was  noted as antegrade.   An MRI follow-up with MRA of neck and head showed an acute infarct in the  left parietal lobe as well as atrophy and chronic ischemic changes  throughout the area.  Carotid arteries were noted as widely patent.  Left  vertebral artery was dominant.  No occlusions were seen.   Echocardiogram showed normal sinus rhythm and evidence of anteroseptal  infarct, age undetermined.  There were frequent premature atrial complexes  with aberrant conduction noted.  On a repeat EKG, it was found that the  patient was likely to have an embolic source given this cardiac finding.  For that reason, the patient was started on IV heparin until Coumadin would  be therapeutic and we planned for his discharge two to three days after  initiation.  However, it took Mr. Dolley two days to develop an INR of 1.1  beginning at 0.9.  He would like to go home and have discussed that he would  like  to spend the Easter holiday with his family.  For that reason, he was  started on heparin  subcu.  He will take 5,000 units b.i.d. subcutaneously and continue with 5  mg Coumadin at the same time.  Dr. Reyes Ivan who was introduced to the patient  here during his hospitalized will be his cardiologist and follow-up with his  INR studies.      CD/MEDQ  D:  08/22/2004  T:  08/22/2004  Job:  [id number]   cc:   Elmore Guise., M.D.  [address]  [place], [place] [address]  Fax: [phone]   Barbee Cough, Dr,

## 2010-09-24 NOTE — Cardiovascular Report (Signed)
NAME:  [name] [name], [name] [name]                ACCOUNT NO.:  [id number]   MEDICAL RECORD NO.:  [id number]          PATIENT TYPE:  INP   LOCATION:  2912                         FACILITY:  [facility]   PHYSICIAN:  Corky Crafts, MDDATE OF BIRTH:  Oct 16, 1929   DATE OF PROCEDURE:  05/24/2005  DATE OF DISCHARGE:                              CARDIAC CATHETERIZATION   REFERRING PHYSICIAN:  Doug Sou, M.D.   PRIMARY CARDIOLOGIST:  Rosine Abe, M.D.   PROCEDURE PERFORMED:  Coronary angiogram.   INDICATIONS:  Sustained ventricular tachycardia with ST elevation in the  anterior leads.   OPERATOR:  Corky Crafts, M.D.   PROCEDURAL NARRATIVE:  The risks and benefits of the procedure were quickly  explained to the patient and family and the patient was taken for emergent  cardiac catheterization.  He was placed on the table and prepped and draped  in the usual sterile fashion.  His right groin was infiltrated with 1%  lidocaine.  A 6-French sheath was placed in his right femoral artery using  modified Seldinger technique.  The right coronary angiography was then  performed.  A JR-40 catheter was advanced in the ascending aorta and placed  in the ostium of the right coronary artery under fluoroscopic guidance.  Digital angiography was performed in multiple projections using hand  injection of contrast.  The left coronary artery angiography was then  performed.  A JL-4.0 catheter was advanced to the ascending aorta and placed  in the ostium of the left main coronary artery.  Digital angiography was  performed in multiple projections using hand injection of contrast.  A  ventriculogram or left heart catheterization was not performed because of a  known prior left ventricular thrombus.  The right groin was closed with  Angio-Seal because the patient had a elevated INR from chronic Coumadin  therapy.   FINDINGS:  The right coronary artery was a large dominant vessel with  moderate  atherosclerosis throughout. There were no hemodynamically  significant lesions that were apparent.  There also were no apparent  collaterals to the left anterior descending artery.   The left main coronary artery was a large vessel with luminal  irregularities. It was widely patent.   The circumflex was a medium-sized vessel with luminal irregularities.   The ramus was a large branching vessel with luminal irregularities.   The left anterior descending was a medium-sized vessel with proximal luminal  irregularities.  In the midportion of the vessel there is a large septal  branch.  Beyond the septal, the LAD was a very small vessel, less than 2.0  mm in diameter.  Despite giving 200 mg of intracoronary nitroglycerin the  size of the vessel did not change.  There was a 90% stenosis noted in the  midportion of the LAD just after the large septal.  The vessel was too small  for percutaneous revascularization.  The left ventriculogram was not  performed because of a known left ventricular thrombus from the angiogram  and gross wall motion abnormality at the apex seen severely hypokinetic.  This seemed consistent with his prior history  of an old MI, which went  untreated and an apical thrombus   IMPRESSION:  1.  No acutely occluded vessel.  The ventricular tachycardia that the      patient suffered was likely secondary to his prior anterior infarction.      His prior electrocardiogram did show Q-waves in the anterior precordium.  2.  A 90% mid left anterior descending stenosis in the mid segment.  This      was an extremely small vessel (less than 2.0 mm) despite intracoronary      nitroglycerin administration.   RECOMMENDATIONS:  1.  No PCI was performed.  2.  Will continue amiodarone drip, to the CCU, continue IV nitroglycerin to      help with any potential component of spasm which may have been present.      The patient did not have any chest pain or tightness at the conclusion       of the procedure.  3.  Further management will be per Dr. Reyes Ivan.  I would consider an EP      consult for possible AICD implantation given the sustained nature of his      ventricular tachycardia  4.  AngioSeal to the right groin was placed because of his increased INR.  5.  The patient states he cannot tolerate aspirin because of significant      stomach discomfort.   There were no apparent complications.      Corky Crafts, MD  Electronically Signed     JSV/MEDQ  D:  05/24/2005  T:  05/24/2005  Job:  [id number]

## 2010-09-24 NOTE — Discharge Summary (Signed)
NAME:  [name] [name], [name] [name]                ACCOUNT NO.:  [id number]   MEDICAL RECORD NO.:  [id number]          PATIENT TYPE:  INP   LOCATION:  4734                         FACILITY:  [facility]   PHYSICIAN:  Elmore Guise., M.D.DATE OF BIRTH:  12-08-1929   DATE OF ADMISSION:  05/24/2005  DATE OF DISCHARGE:  05/27/2005                                 DISCHARGE SUMMARY   DISCHARGE DIAGNOSES:  1.  Sustained ventricular tachycardia.  2.  Recent non-ST-elevation myocardial infarction.  3.  History of coronary artery disease.  4.  History of left ventricular thrombus and patent foramen ovale with right-      to-left shunt.  5.  Hypertension.  6.  Dyslipidemia.   HISTORY OF PRESENT ILLNESS:  The patient is a very pleasant 77 year old  white male with past medical history of stroke, hypertension and coronary  artery disease, who presented in wide complex tachycardia.  On EMS arrival,  the patient was in ventricular tachycardia with a heart rate of 180 beats  per minute.  He was given a bolus of lidocaine with little response and then  started on amiodarone.  On arrival to the emergency room, he had converted  back to normal sinus rhythm.  He was taken urgently to the cardiac cath lab  for further evaluation.  Once arrived in the cath lab, his cath showed  nonobstructive RCA and circumflex disease and severe obstruction in his mid  LAD; however, the vessel was very small at that area, less than 2 mm.  He  was not thought to be an interventional candidate and was treated medically.   HOSPITAL COURSE:  The patient was admitted to the CCU for further  evaluation.  He had an EP consult on hospital day #2.  Because of his  ventricular tachycardia, he underwent ICD implant without any complication.  On post-procedure day 1, he did develop a brief episode of atrial  fibrillation with his heart rate ranging anywhere from 80-110 beats per  minute.  He was kept 1 additional day, his atrial fibrillation  resolved  after 18 hours and he was discharged home on May 27, 2005 in normal  sinus rhythm.  On discharge, his hemoglobin was stable, his INR was 2.3 and  his peak troponin during his hospitalization was in the 5 range.   DISCHARGE MEDICATIONS:  1.  Lasix 20 mg daily.  2.  Lisinopril 20 mg twice daily.  3.  Metoprolol 75 mg twice daily.  4.  Coumadin 5 mg 5 days a week, 2.5 mg 2 days a week.  5.  Felodipine 7.5 mg daily.  6.  Simvastatin 40 mg daily (he is to hold this for 2 weeks, then resume his      prior dose).  7.  Colace 100 mg twice daily.  8.  Aspirin 81 mg daily.   ACCESSORY CLINICAL DATA:  He did have an echocardiogram done during his  hospitalization.  His EF was 40% to 50% with distal anteroseptal, periapical  akinesis and a mural thrombus noted at the apex.  No significant valvular  heart disease was  noted.   FOLLOWUP:  He will follow up with Dr. Lady Deutscher at [facility]  in 2 weeks and he will return for followup with Dr. Lewayne Bunting at [facility] on Wednesday, January31, 2007, for a wound check.  He is to call  office with any questions or concerns.      Elmore Guise., M.D.  Electronically Signed     TWK/MEDQ  D:  05/27/2005  T:  05/28/2005  Job:  [id number]

## 2010-09-24 NOTE — Op Note (Signed)
   NAME:  [name] [name], [name] [name]                          ACCOUNT NO.:  [id number]   MEDICAL RECORD NO.:  [id number]                   PATIENT TYPE:  AMB   LOCATION:  [facility]                                 FACILITY:  [facility]   PHYSICIAN:  Ronald L. Ovidio Hanger, M.D.           DATE OF BIRTH:  March 15, 1930   DATE OF PROCEDURE:  09/06/2002  DATE OF DISCHARGE:                                 OPERATIVE REPORT   DIAGNOSIS:  Recurrent bladder lesion.   OPERATIVE PROCEDURE:  Cystoscopic cold cup removal of bladder lesions.   SURGEON:  Lucrezia Starch. Earlene Plater, M.D.   ANESTHESIA:  General laryngeal airway.   ESTIMATED BLOOD LOSS:  Negligible.   TUBES:  None.   COMPLICATIONS:  None.   INDICATIONS FOR PROCEDURE:  [name] [name] is a very nice 77 year old white male,  who has undergone a right nephroureterectomy in the past for transitional  cell carcinoma.  He has had periodic recurrent superficial transitional cell  carcinoma.  He has undergone some intravesical therapy with BCG in the past.  He presented for his surveillance cystourethroscopy and was found to have  punctate lesion posterior bladder wall and on the right side of the lateral  bladder wall and after understanding risks. benefits, and alternatives,  elected to proceed with cystoscopic cold cup removal.   PROCEDURE IN DETAIL:  The patient was placed in a supine position.  After  proper general laryngeal airway anesthesia, was placed in the dorsal  lithotomy position and prepped and draped with Betadine in a sterile  fashion.  Cystourethroscopy was performed with the 22.5 French Olympus  panendoscope, utilizing the 12 and 70 degree lenses.  The bladder was  carefully inspected.  There was mild trilobar hypertrophy, and efflux of  urine was noted from the normally-placed left ureteral orifice.  On the  right posterior bladder wall, there were two punctate lesions approximately  1 mm to 2 mm each, and these were removed by cold cup biopsy forceps and  sent to pathology.  The base was cauterized with Bugbee coagulation cautery.  No other lesions were noted.  The bladder was drained.  The panendoscope was  removed, and the patient was taken to the recovery room in stable condition.                                               Ronald L. Ovidio Hanger, M.D.    RLD/MEDQ  D:  09/06/2002  T:  09/06/2002  Job:  [id number]

## 2010-09-24 NOTE — H&P (Signed)
NAME:  BRAXTEN, MEMMER                ACCOUNT NO.:  [id number]   MEDICAL RECORD NO.:  [id number]          PATIENT TYPE:  INP   LOCATION:  2906                         FACILITY:  [facility]   PHYSICIAN:  Francisca December, M.D.  DATE OF BIRTH:  16-Dec-1929   DATE OF ADMISSION:  09/17/2005  DATE OF DISCHARGE:                                HISTORY & PHYSICAL   REASON FOR ADMISSION:  Symptomatic VT with ICD discharge.   HISTORY OF PRESENT ILLNESS:  [name] is a 77 year old man who is 5  months S/P ICD implantation for symptomatic VT.  He has done well until  yesterday.  He began to feel dizzy and then while walking to the mailbox,  had the defibrillator discharge.  He had prompt resolution of his symptoms.  Today, late in the afternoon, he was walking to the bathroom and again felt  lightheaded and the device is discharged.  EMS was called and he had 2  subsequent discharges in route.  The discharge while in the ambulance  documented VT at 160 beats per minute.  Upon arrival to the emergency room,  he received 300 mg IV amiodarone and has had no further evidence of  arrhythmia.  No further discharge.  He feels okay.  He is not short of  breath and is not having any chest discomfort.   PAST MEDICAL HISTORY:  1.  He is status post nephrectomy and partial bladder resection for      transitional bladder cell carcinoma.  He has had multiple BCG treatments      and is currently on chemotherapy.  2.  Remote myocardial infarction in 1995.  3.  CAD and cath in January 2007 that showed a 90% obstruction to the mid      portion of the LAD, which was very small distally; otherwise, luminal      irregularities only.  Ejection fraction 45-50% with an akinetic anterior      wall.  4.  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  There is apparent      intracardiac shunt, per the patient.  5.  Hypertension.   CURRENT MEDICATIONS:  1.  Lisinopril 40 mg p.o. daily.  2.  Felodipine ER 10 mg p.o.  daily.  3.  Furosemide 20 mg p.o. daily.  4.  Warfarin varying dosages, currently 5 mg p.o. daily.  5.  Simvastatin 80 mg p.o. daily.  6.  Aspirin 81 mg p.o. daily.   ALLERGIES:  DRUG ALLERGIES TO PENICILLIN.   FAMILY HISTORY:  Noncontributory.   REVIEW OF SYSTEMS:  Negative except as mentioned above.  He denies any  visual changes and diplopia.  He has not had epistaxis.  No difficulty  swallowing.  No cough or hemoptysis.  No chest discomfort is mentioned.  No  wheezing.  He has no abdominal pain, diarrhea or constipation.  No  hematochezia.  No melena.  No dysuria or hematuria, or nocturia.  He does  have difficulty holding onto his urine at times.  He has minimal joint pain.  No muscle weakness.   PHYSICAL EXAMINATION:  VITAL SIGNS:  The blood pressure is 146/74, pulse is  90 and regular, respiratory rate 18 and temperature 97.3.  GENERAL:  This is a well-appearing, comfortable 77 year old Caucasian man in  no distress.  HEENT:  Unremarkable.  Head is atraumatic and normocephalic.  The pupils  equal, round, reactive to light and accommodation.  Extraocular movements  are intact.  Oral mucosa pink and moist.  Teeth and gums in good, and  adequate repair.  Tongue is not coated.  NECK:  Supple without thyromegaly or masses.  The carotid upstrokes are  normal.  There are no bruits.  There is no jugular venous distension.  CHEST:  Clear with adequate excursion.  HEART:  Regular rhythm.  Normal S1 and S2 is heard.  No S3 or S4, murmur,  click or rub noted.  ABDOMEN:  Soft, flat and nontender.  No hepatosplenomegaly or midline  pulsatile masses.  Bowel sounds are present in all quadrants.  EXTERNAL GENITALIA:  Normal and I felt descended testicles.  No lesions.  RECTAL:  Not performed.  EXTREMITIES:  A full range of motion.  No edema.  Intact distal pulses.  NEUROLOGICALLY:  Cranial nerves II-XII are intact.  Motor and sensory are  grossly intact.  Gait not tested.  SKIN:  Warm and  dry, and clear.   Electrocardiogram shows sinus rhythm with single PVC, evidence of anterior  wall myocardial infarction and incomplete right bundle branch block.  Admission hemogram is normal as are the serum electrolytes, BUN and  creatinine.  Glucose is 143.  Initial point of care CK-MB is 1.1, troponin  less than 0.05 and myoglobin 81.6.  Chest x-ray is pending.   IMPRESSION:  1.  Multiple apparent appropriate discharges of defibrillator associated      with presyncope.  2.  Apparent recurrent ventricular tachycardia at a rate of 160 beats per      minute.  3.  Coronary artery disease with previous myocardial infarction in lower      lumina.  Normal ejection fraction.  Anterior scar.  4.  No evidence of electrolyte disorder or ongoing coronary      ischemia/myocardial infarction.  5.  Other problems, per past medical history.   PLAN:  The patient is admitted for stepdown monitoring.  He will begin IV  amiodarone and continue warfarin, both per pharmacy.  We will obtain a  device interrogation in the near future.  We will check magnesium and  calcium, serial cardiac enzymes and likely an EP consult will be needed.      Francisca December, M.D.  Electronically Signed     JHE/MEDQ  D:  09/17/2005  T:  09/19/2005  Job:  [id number]   cc:   Tiney Rouge, M.D.

## 2010-09-24 NOTE — H&P (Signed)
NAME:  [name], [name]                ACCOUNT NO.:  [id number]   MEDICAL RECORD NO.:  [id number]          PATIENT TYPE:  INP   LOCATION:  1823                         FACILITY:  [facility]   PHYSICIAN:  Pramod P. Pearlean Brownie, MD    DATE OF BIRTH:  07-11-1929   DATE OF ADMISSION:  08/18/2004  DATE OF DISCHARGE:                                HISTORY & PHYSICAL   REFERRING PHYSICIAN:  Lorre Nick, M.D.   REASON FOR REFERRAL:  Code stroke.   HISTORY OF PRESENT ILLNESS:  [name] is a 77 year old Caucasian male who  developed sudden onset of right upper extremity weakness at about 5:30 p.m.  this evening while walking in the yard.  The patient also noticed that his  right leg felt a little heavy and numb.  His symptoms started improving  after he came to the ER.  Code stroke was called at 6:36 p.m.  I saw the  patient at 5:50 p.m.  He had noted significant improvement in his right  upper extremity weakness, but still had significant grip and hand weakness.  His right leg weakness and numbness are improved.  CT scan of the head was  interpreted by me as showing no acute abnormality.  His past neurologic  history is not significant for __________ stroke, TIA's, seizures,  neurologic problems.   PAST MEDICAL HISTORY:  Hypertension, hyperlipidemia, remote myocardial  infarction, cancer of the prostate, kidney, and bladder.   PAST SURGICAL HISTORY:  Nephrectomy.   ALLERGIES:  PENICILLIN.   PRIMARY CARE PHYSICIAN:  Bryan Lemma. Ehinger, M.D.   SOCIAL HISTORY:  Retired, used to work in [employer].  He lives with  his wife in [place].  He is fully active and independent with activities  of daily living.  Does not smoke or drink.   REVIEW OF SYSTEMS:  Not significant for chest pain, cough, shortness of  breath, diarrhea, or other recent illness.   MEDICATIONS:  1.  Zocor.  2.  Metoprolol.  3.  Prinivil.  4.  Furosemide.  5.  Plendil.   PHYSICAL EXAMINATION:  GENERAL:  A pleasant,  well-developed Caucasian male  in no acute distress.  VITAL SIGNS:  Afebrile.  Pulse 84 per minute and regular, respiratory rate  16 per minute, distal pulses were felt, blood pressure __________ 175/85.  HEENT:  Head is atraumatic. ENT examination unremarkable.  NECK:  Supple without bruits.  HEART:  No murmur or gallop.  LUNGS:  Clear to auscultation.  NEUROLOGY:  The patient is pleasant, awake, alert and cooperative.  There is  no aphasia, apraxia or dysarthria.  Pupils are equal and reactive.  Eye  movements are full range without nystagmus.  Visual acuity and __________.  Bilateral movements are normal.  Tongue is midline.  Motor system  examination reveals right upper extremity drift.  There is significant  weakness of the right grip and intrinsic hand muscles.  He orbits the left  over right upper extremity.  He has good strength in the elbow and  shoulders.  Right lower extremity shows slight drift, but good strength  proximally  at the hips and distally at the ankles.  Deep tendon reflexes are  2+ and symmetric.  Sensation is intact.  Coordination is slightly impaired  on the right.  Gait was not tested.   LABORATORY DATA:  Noncontrast CAT scan done on the head today shows no acute  abnormalities.  Old small left frontal cortical infarct is noted.   EKG shows normal sinus rhythm.   Coagulation labs are normal.  WBC count is unremarkable.  Basic metabolic  panel is pending.   IMPRESSION:  A 77 year old gentleman with sudden onset of right upper  extremity weakness likely due to small left hemispheric subcortical  infarction.  The patient has presented within 3 hours of onset of symptoms  and will qualify for IV thrombolysis.  Even though the patient has shown  significant improvement, he still has significant weakness in the right grip  and intrinsic hand muscles and will be functionally disabled if it does not  improve further with it being his dominant hand.  I have  discussed the risks  and benefits of IV PPA with the patient's wife including the 6.4 __________  dose for intracerebral hemorrhage and the family and the patient want to  proceed.  Will give full dose of 0.9 mg per kg IV PPA and as per protocol  monitor in the ICU to maintain strict control of blood pressure.  Check MRI  scan of the brain, Doppler studies, echocardiogram, stroke, risk  stratification, and labs.  He has not been on antiplatelet therapy in the  past because of GI hemorrhage with aspirin.  He may perhaps need to go on  Aggrenox in the future unless a definite cardiac source of embolism is  found.  __________ 1 hour of neuro critical care time at the patient's  bedside monitoring his examination and directing his care and watching his  blood pressure.      PPS/MEDQ  D:  08/18/2004  T:  08/19/2004  Job:  [id number]   cc:   Bryan Lemma. Manus Gunning, M.D.  [address] [place]  [place] [address]  Fax: [phone]

## 2010-09-24 NOTE — H&P (Signed)
NAME:  [name] [name], [name]                ACCOUNT NO.:  [id number]   MEDICAL RECORD NO.:  [id number]          PATIENT TYPE:  INP   LOCATION:  1419                         FACILITY:  [facility]   PHYSICIAN:  Elmore Guise., M.D.DATE OF BIRTH:  1929-07-02   DATE OF ADMISSION:  11/29/2005  DATE OF DISCHARGE:                                HISTORY & PHYSICAL   INDICATION FOR ADMISSION:  Monitoring in the time of bladder biopsy for  history of bladder cancer.   HISTORY OF PRESENT ILLNESS:  The patient is a very pleasant 77 year old  white male with a past medical history of coronary artery disease, mild LV  dysfunction (EF 45%), history of CVA with organized apical thrombus (on long-  term Coumadin), history of bladder cancer, history of ventricular  tachycardia, who was admitted for telemetry monitoring while undergoing  bladder biopsy.  The patient is currently without complaint.  He denies any  chest pain or shortness of breath.  He has had no discharges from his  device, and on last interrogation had no further episodes of ventricular  tachycardia.  He has had no unstable symptoms.  He was last seen in the  office on November 11, 2005, and at that time was doing very well.  His  medications have been adjusted appropriately.   REVIEW OF SYSTEMS:  Otherwise negative.   CURRENT MEDICATIONS:  1.  Coumadin.  2.  Aspirin 81 mg daily.  3.  Metoprolol 50 mg b.i.d.  4.  Lisinopril 20 mg daily.  5.  Felodipine 2.5 mg daily.  6.  Lipitor 40 mg daily.  7.  Amiodarone 200 mg daily.   ALLERGIES:  1.  PENICILLIN.  2.  VANCOMYCIN.   FAMILY HISTORY:  Noncontributory.   SOCIAL HISTORY:  No tobacco or alcohol.   PHYSICAL EXAMINATION:  VITAL SIGNS:  He is afebrile.  Blood pressure is  170/80, heart rate is in the low 60s.  GENERAL:  He is a very pleasant white male, alert and oriented x4, in no  acute distress.  NECK:  He has no JVD and no bruits.  LUNGS:  Clear.  HEART:  Regular with a normal  S1-S2.  No significant murmurs, gallops or  rubs.  ABDOMEN:  Soft, nontender, nondistended.  EXTREMITIES:  Warm with trace pretibial and pedal edema.   LABORATORY DATA:  Pending at the time of dictation.   IMPRESSION:  1.  History of ventricular tachycardia and coronary artery disease.  2.  History of apical thrombus.  3.  History of hypertension.  4.  History of bladder cancer for bladder biopsy in the morning.   PLAN:  1.  At the current time, we will continue his medications.  He has been off      his Coumadin for the last 3 days.  We will give him one shot of Lovenox      this evening.  He should need no further anticoagulation.  Will continue      telemetry monitoring.  If everything goes well with his procedure      tomorrow, anticipate discharge home in  the evening.  I discussed this at      length with him.  However, should he have any problems with arrhythmias,      CHF, chest pains in and around the time of the procedure, will delay      discharge until the patient is deemed stable.      Elmore Guise., M.D.  Electronically Signed     TWK/MEDQ  D:  11/29/2005  T:  11/29/2005  Job:  [id number]

## 2010-09-24 NOTE — H&P (Signed)
NAME:  [name] [name]                ACCOUNT NO.:  [id number]   MEDICAL RECORD NO.:  [id number]          PATIENT TYPE:  INP   LOCATION:  1401                         FACILITY:  [facility]   PHYSICIAN:  Elmore Guise., M.D.DATE OF BIRTH:  Dec 30, 1929   DATE OF ADMISSION:  07/19/2005  DATE OF DISCHARGE:                                HISTORY & PHYSICAL   REASON FOR ADMISSION:  Heparin bridge secondary to history of LV thrombus  for bladder tumor removal.   HISTORY OF PRESENT ILLNESS:  [name] [name] is a very pleasant 77 year old white  male with extensive cardiac history including coronary artery disease,  history of ventricular tachycardia, history of apical wall motion  abnormality with apical wall thrombus, history of CVA likely secondary to  apical wall thrombus on chronic Coumadin, who is admitted for elective  bladder surgery.  The patient did have hospitalization in mid January when  he was hospitalized January 15 through January 19 for evaluation of  ventricular tachycardia.  He underwent ICD implant during that time.  Since  being at home, he reports he is doing okay.  He states he has been nervous  and anxious about his upcoming procedure.  He continues to have off and on  hematuria as well as occasional bright red blood per rectum.  He stopped  Coumadin on March 11 in preparation for his upcoming surgery and will be  admitted today to start heparin bridge prior to his procedure. He denies any  chest pain, shortness of breath, dyspnea on exertion.  He does have  occasional lower extremity edema as well as palpitations.  He has been using  rectal suppositories which have seemed to help pretty significantly with his  rectal bleeding.  He reports he is taking his medicines as instructed with  no significant side effects.   REVIEW OF SYSTEMS:  Otherwise negative or per HPI.   CURRENT MEDICATIONS:  1.  Zocor 40 mg daily.  2.  Metoprolol 75 mg twice daily.  3.  Lisinopril 20 mg twice  daily.  4.  Lasix 20 mg daily.  5.  Coumadin on hold for the last 3 days.  6.  Plendil 7.5 mg daily.  7.  Aspirin 81 mg daily.  8.  Canasa suppositories 1 g nightly.  9.  Glucolax once daily.   ALLERGIES:  1.  PENICILLIN causing swelling.  2.  VANCOMYCIN causing rash.   FAMILY HISTORY:  Noncontributory.   SOCIAL HISTORY:  He is married; 25-pack-year history of tobacco, quit over  10 years ago.  No significant alcohol intake.   PHYSICAL EXAMINATION:  VITAL SIGNS: His weight is 240 pounds.  Blood  pressure 158/80, heart rate 80 and regular.  GENERAL:  He is a very pleasant white male, alert and oriented x4, no acute  distress.  NECK:  He has no JVD.  LUNGS: Clear.  HEART: Regular with a normal S1 and S2.  His defibrillator site is well  healed.  ABDOMEN: Soft, nontender, nondistended.  EXTREMITIES: Warm with 2+ pulses and trace pedal edema.  NEUROLOGIC: Cranial nerves are intact with no  significant focal deficits.   His echocardiogram was reviewed from December 2006 showing EF of 55 to 60%  with distal, septal, and apical akinesis and apical thrombus noted.  He had  diastolic dysfunction, mild mitral and tricuspid valve regurgitation, and  mild to moderate pulmonary hypertension with PA systolic pressure 45 to 50  mmHg.   He had agitated saline contrast study which was positive for patent foramen  ovale and right-to-left shunt.   He had a repeat echocardiogram January 16 after prolonged ventricular  tachycardia which was essentially unchanged; however, his EF was 45 to 50%.   He does have multiple past surgical history including prostate surgery,  bladder surgery x8, and past history of nephrectomy.   His most recent blood work showed a white count of 7.8, hemoglobin 14.6,  platelet count of 209 with BUN and creatinine of 19 and 1.2, respectively,  and potassium level of 4.   IMPRESSION:  1.  History of coronary artery disease.  2.  History of ventricular  tachycardia.  3.  Hypertension.  4.  History of left ventricular thrombus.  5.  History of patent foramen ovale.  6.  Chronic Coumadin therapy.  7.  Recurrent bladder cancer, for bladder procedure at 10:30 tomorrow.   PLAN:  The patient will be admitted to telemetry monitoring.  He will start  heparin for bridge therapy to help protect him while his Coumadin level is  subtherapeutic.  We will restart back his Coumadin once his bleeding has  subsided from his urological procedure. We will continue his metoprolol,  Lisinopril, Zocor, aspirin, and Lasix as listed above.      Elmore Guise., M.D.  Electronically Signed     TWK/MEDQ  D:  07/19/2005  T:  07/19/2005  Job:  [id number]

## 2010-09-24 NOTE — Discharge Summary (Signed)
NAME:  [name] [name], [name] [name]                ACCOUNT NO.:  [id number]   MEDICAL RECORD NO.:  [id number]           PATIENT TYPE:   LOCATION:                                 FACILITY:   PHYSICIAN:  Elmore Guise., M.D.DATE OF BIRTH:  1929/06/30   DATE OF ADMISSION:  09/17/2005  DATE OF DISCHARGE:  09/29/2005                                 DISCHARGE SUMMARY   DISCHARGE DIAGNOSIS:  1.  Ventricular tachycardia.  2.  History of coronary artery disease.  3.  History of mild left ventricular dysfunction.  4.  Dyslipidemia.  5.  History of bladder cancer status post 5 treatments of intravesical BCG      and interferon treatment.  6.  History of PFO with organized anterior apical mural thrombus.   HISTORY OF PRESENT ILLNESS:  The patient is a very pleasant 77 year old  white male who was admitted after multiple ICD discharges.  His ICD was  interrogated; and he had 255 episodes over the last month; most were treated  with ATP therapy with resolution; however, he was admitted after he had  multiple ICD discharges (shocks) at home.   HOSPITAL COURSE:  The patient was admitted to the CCU; there he continued to  have episodes of ventricular tachycardia.  He had 3 more ICD shocks during  his hospitalization.  His ICD was interrogated and reprogrammed.  Due to his  ventricular tachycardia coming in slower than his ICD was set up; his lower  VT threshold was also reset to provide detection and therapy.  Patient was  given amiodarone drip and continued on this for approximately 1 week.   His hospitalization was complicated by left arm cellulitis for which he  received a full 7-day treatment of Ancef and Keflex.  He did undergo a  noninvasive EP study prior to transfer to the floor; and no inducible  ventricular tachycardia was seen.  He had been transferred to the floor and  done well for the last 36 hours.  He will be discharged home today to  continue the following medications.   DISCHARGE  MEDICATIONS:  1.  Lisinopril 40 mg daily.  2.  Felodipine 7.5 mg daily.  3.  Lasix 20 mg daily.  4.  Coumadin as before 5 mg x5 and 2.5 mg x2.  5.  Lipitor 40 mg daily (this takes the place of Zocor because of the      interaction that it has with amiodarone).  6.  Aspirin 81 mg daily.  7.  Amiodarone 400 mg (2 tablets) 3x daily for 2 weeks, then 2 tablets twice      daily x3 weeks, then 2 tablets daily indefinitely.  8.  Metoprolol 75 mg b.i.d.  9.  Milk of magnesia on a p.r.n. basis.   FOLLOWUP:  The patient will have follow-up appointment with Dr. Reyes Ivan with  routine blood check with PT/INR on Tuesday May29; and then an office visit  on June4 at 9:30 a.m.  He will also follow up with Dr. Berton Mount with  [facility] on June20 at 9:30 a.m.  He  was advised to no driving for  the next 4 weeks.  I also discussed his care and hospitalization with Dr.  Earlene Plater of [facility] and he will follow up with Dr. Earlene Plater in  approximately 2 months.      Elmore Guise., M.D.  Electronically Signed     TWK/MEDQ  D:  09/30/2005  T:  09/30/2005  Job:  [id number]

## 2010-09-24 NOTE — Op Note (Signed)
NAME:  [name] [name], [name] [name]                ACCOUNT NO.:  [id number]   MEDICAL RECORD NO.:  [id number]          PATIENT TYPE:  INP   LOCATION:  1419                         FACILITY[facility]   PHYSICIAN:  Ronald L. Earlene Plater, M.D.  DATE OF BIRTH:  09-24-1929   DATE OF PROCEDURE:  11/30/2005  DATE OF DISCHARGE:                                 OPERATIVE REPORT   DIAGNOSIS:  History of bladder cancer, status post BCG.   OPERATIVE PROCEDURE:  Cystoscopy, bladder biopsy.   SURGEON:  Lucrezia Starch. Earlene Plater, M.D.   ANESTHESIA:  MAC.   ESTIMATED BLOOD LOSS:  Negligible.   TUBES:  None.   COMPLICATIONS:  None.   INDICATIONS FOR PROCEDURE:  [name] [name] is a very nice white male who has had  multiple recurrent bladder tumors.  He has had BCG recently and is for  follow-up biopsy.  He has significant cardiac issues and had to be admitted  to the hospital to appropriately stop his anticoagulation.  After  understanding risks, benefits, and alternatives, he elected to proceed.   PROCEDURE IN DETAIL:  The patient was placed in the supine position.  After  MAC analgesia, 10 mL of 2% Xylocaine was injected into the penile urethra.  Cystourethroscopy was performed with a 22.5-French Olympus panendoscope.  The bladder was inspected.  There were some radiation changes, some slight  thickening, and the solitary orifice appeared to be in normal location.  No  specific lesions were noted to be present.  Biopsy was obtained with the  posterior midline with cold cup biopsy forceps and submitted to pathology.  The base was cauterized with Bugbee coagulation cautery.  No other lesions  were noted.  The bladder was drained, the panendoscope was removed, and the  specimens submitted to pathology.      Ronald L. Earlene Plater, M.D.  Electronically Signed     RLD/MEDQ  D:  11/30/2005  T:  11/30/2005  Job:  [id number]

## 2010-09-24 NOTE — Op Note (Signed)
NAME:  [name] [name], [name] [name]                ACCOUNT NO.:  [id number]   MEDICAL RECORD NO.:  [id number]          PATIENT TYPE:  INP   LOCATION:  2912                         FACILITY:  [facility]   PHYSICIAN:  Doylene Canning. Ladona Ridgel, M.D.  DATE OF BIRTH:  1929-11-26   DATE OF PROCEDURE:  05/25/2005  DATE OF DISCHARGE:                                 OPERATIVE REPORT   PROCEDURE PERFORMED:  Implantation of a single-chamber defibrillator with  defibrillation threshold testing.   SURGEON:  Doylene Canning. Ladona Ridgel, M.D.   INDICATIONS:  Sustained monomorphic ventricular tachycardia in the setting  of ischemic cardiomyopathy associated with symptoms.   I. INTRODUCTION:  The patient is a 77 year old male with a history of  coronary disease, status post myocardial infarction the past.  The patient  was in his usual state of health when he developed sustained tachy-  palpitations and was found to be in a sustained right bundle branch block  ventricular tachycardia at a rate of 170 beats per minute, which terminated  with medical therapy.  It was associated with hemodynamic instability.  The  patient was taken to the emergency room and admitted to hospital, where he  underwent urgent catheterization, demonstrating no new obstructive lesions  his coronary arteries.  He is now referred for ICD implantation.   II. PROCEDURE:  After informed consent was obtained, the patient was taken  to the diagnostic EP lab in a fasting state.  After the usual preparation  and draping, intravenous fentanyl and  midazolam were given for sedation.  A  total of 30 mL of lidocaine were infiltrated into the left infraclavicular  region.  A 9-cm incision was carried out over this region and electrocautery  utilized to dissect down to the fascial plane.  Ten milliliters of contrast  was were injected into the left upper extremity venous system, demonstrating  a patent left subclavian vein.  This was subsequently punctured and the St.  Jude  model 04540 65-cm active-fixation defibrillation lead, serial number  [id number], was advanced into the right ventricle.  Mapping was carried out.  In the inferior septum of the RV apex, R waves measured 9 mV and the pacing  threshold 0.5 volts at 0.5 milliseconds with a pacing impedance of 757 ohms  with the lead actively affixed.  Ten-volt pacing in this location did not  stimulate the diaphragm.  At this point, the lead was secured to the  subpectoralis fascia with a figure-of-eight silk suture.  The sew-in sleeve  was also secured with silk suture.  Electrocautery was then utilized to  assure hemostasis.  Kanamycin irrigation was utilized to irrigate the  pocket.  The St. Jude Atlas II VR, serial number [id number], was then connected  to the defibrillation lead and placed in the previously made subcutaneous  pocket.  The generator was secured with a silk suture.  Defibrillation  threshold testing was carried out.   After the patient was more deeply sedated with fentanyl and Versed, VF was  induced with a T wave shock and a 15-joule shock was delivered, terminating  ventricle fibrillation and restoring sinus  rhythm.  Five minutes were  allowed to elapse and a second DFT test carried out.  Again, VF was induced  with a T wave shock and again, a 15-joule shock was delivered which  terminated ventricular fibrillation and restored sinus rhythm.  At this  point, no additional defibrillation threshold testing was carried out and  the incision was closed with a layer of 2-0 Vicryl followed by a layer of 3-  0 Vicryl, followed by a layer of 4-0 Vicryl.  Benzoin was painted on the  skin, Steri-Strips were applied and a pressure dressing was placed and the  patient was returned to his room in satisfactory condition.   III. COMPLICATIONS:  There were no immediate procedure complications.   IV. RESULTS:  This demonstrated successful implantation of a single-chamber  St. Jude defibrillator in a patient  with an ischemic cardiomyopathy and  sustained monomorphic VT, which was hemodynamically unstable.           ______________________________  Doylene Canning. Ladona Ridgel, M.D.     GWT/MEDQ  D:  05/25/2005  T:  05/25/2005  Job:  [id number]   cc:   Elmore Guise., M.D.  Fax: [phone]   Bryan Lemma. Manus Gunning, M.D.  Fax: [phone]

## 2010-09-24 NOTE — Op Note (Signed)
NAME:  [name] [name], [name] [name]                ACCOUNT NO.:  [id number]   MEDICAL RECORD NO.:  [id number]          PATIENT TYPE:  INP   LOCATION:  2921                         FACILITY:  [facility]   PHYSICIAN:  Duke Salvia, M.D.  DATE OF BIRTH:  1929/11/28   DATE OF PROCEDURE:  09/27/2005  DATE OF DISCHARGE:                                 OPERATIVE REPORT   PREOPERATIVE DIAGNOSIS:  Ventricular tachycardia.   POSTOPERATIVE DIAGNOSIS:  Ventricular tachycardia.   PROCEDURE PERFORMED:  Noninvasive programmed stimulation via patient's  defibrillator.   The patient has a St. Jude V168 ICD in.  Attempts were made to induce  ventricular tachycardia with singles at 400:600 and doubles at 400:600 msec  as well as double and triple ventricular stimuli pace cycle length 400 msec.  Ventricular tachycardia could not be induced.   Review of the defibrillator demonstrated three episodes of ventricular  tachycardia over the last couple of days and I have empirically reprogrammed  the ATP algorithm to 70% of the cycle length which right now is running  about 410 msec.  We will continue to watch it noninvasively at this point  via telemetry and not subject the patient to further program stimulation as  the inducibility of the VT was not readily accomplished.           ______________________________  Duke Salvia, M.D.     SCK/MEDQ  D:  09/27/2005  T:  09/28/2005  Job:  [id number]   cc:   Elmore Guise., M.D.  Fax: [phone]   Electrophys lab   [employer] pacemaker cl

## 2010-09-24 NOTE — Consult Note (Signed)
NAME:  [name] [name], [name] [name]                ACCOUNT NO.:  [id number]   MEDICAL RECORD NO.:  [id number]          PATIENT TYPE:  INP   LOCATION:  3106                         FACILITY:  [facility]   PHYSICIAN:  Elmore Guise., M.D.DATE OF BIRTH:  27-Dec-1929   DATE OF CONSULTATION:  08/20/2004  DATE OF DISCHARGE:                                   CONSULTATION   REASON FOR CONSULTATION:  Remote history of myocardial infarction with now  left ventricular apical thrombus.   REFERRING PHYSICIAN:  Pramod P. Pearlean Brownie, MD   HISTORY OF PRESENT ILLNESS:  The patient is a very pleasant 77 year old  white male with a past medical history of diabetes mellitus (diet  controlled), hypertension, dyslipidemia, remote history of MI, who was  admitted with left MCA CVA. He presented and within 30 minutes of his  symptoms received TPA with no significant residual. An echocardiogram was  performed yesterday showing apical dyskinesis and apical clot. The patient  has now been started on heparin and Coumadin. 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 He  denies any orthopnea, PND, or lower extremity edema. Reports that his blood  pressure at home typically runs in the 130 to 140 range. He does report  occasional PVC and he had a history of being on long-term aspirin, however  this was stopped secondary to chronic hematuria. He has had not had any  hematuria for two years and this was thought to be related to his bladder  cancer.   REVIEW OF SYSTEMS:  Otherwise negative.   PAST MEDICAL HISTORY:  Positive for borderline diabetes, hypertension,  dyslipidemia, history of anterior, septal, and apical MI in the early 1990s,  history of transitional cell cancer, status post right nephrectomy, history  of recurrent bladder cancer, history of prostate cancer.   CURRENT MEDICATIONS:  1.   Plendil 2.5 mg daily.  2.  Lasix 20 mg daily.  3.  Lisinopril 20 mg b.i.d.  4.  Toprol XL 50 mg b.i.d.  5.  Zocor 20 mg daily.   ALLERGIES:  PENICILLIN.   FAMILY HISTORY:  Noncontributory.   SOCIAL HISTORY:  He is married. History of tobacco approximately 25-pack  years, quit greater than 10 years ago.  No alcohol.   PHYSICAL EXAMINATION:  VITAL SIGNS: He is afebrile, temperature 98.7, pulse  in the 80s, blood pressure 170 to 180 over 90 to 100. He is saturating 98%  on room air.  GENERAL: He is a very pleasant elderly white male alert and oriented times  four, in no acute distress.  NECK: Supple. No lymphadenopathy. 2+ carotids. No jugular venous distention.  LUNGS: Clear.  HEART: Regular Normal S1 and S2. No S3 or S4 noted.  ABDOMEN: Soft, nontender, nondistended.  EXTREMITIES: Warm with 2+ pulses, and no edema.  NEUROLOGIC: His right arm weakness has now resolved. He does have mild  weakness in the right hand, but it seems to be improving for patient also.  LABORATORY DATA:  Hemoglobin 13.4, white blood cell count 8.9, platelet  count 213,000. Coags normal on admission. BUN and creatinine 23 and 1.4,  potassium 3.4, glucose 131. LFTs are normal. Hemoglobin A1C 6.6. ECG showed  normal sinus rhythm at 92 per minute with old anteroseptal MI.   IMPRESSION:  1.  Known coronary artery disease with remote history of anteroseptal and      apical myocardial infarction. Recent echo showing  apical thrombus.  2.  Borderline diabetes.  3.  Hypertension.  4.  Dyslipidemia.   PLAN:  1.  From coronary artery disease, the patient is stable. He is on      metoprolol, ACE inhibitor. Would recommend adding aspirin 81 mg for his      regimen.  2.  Hypertension most likely exacerbated from his acute events. He reports      good control at home. If he continues to run high I would recommend      increasing his Toprol to 75 mg b.i.d., continue his other medications as      before.  3.   Recent CVA with no significant residual at the current time. Also has LV      thrombus. Recommend heparin and Coumadin with goal INR in the 2 to 3      range. He should continue his Coumadin for at least six months with      follow-up echo in six months. The patient will either follow up with      myself at [facility] or Dr. Garnette Scheuermann at [facility]      per his convenience. Please call if any concerns or other questions. I      do recommend long-term anticoagulation as stated above at this time.      TWK/MEDQ  D:  08/20/2004  T:  08/20/2004  Job:  [id number]

## 2010-09-24 NOTE — H&P (Signed)
NAME:  [name] [name], [name] [name]                ACCOUNT NO.:  [id number]   MEDICAL RECORD NO.:  [id number]          PATIENT TYPE:  INP   LOCATION:  1827                         FACILITY:  [facility]   PHYSICIAN:  Ulyses Amor, MD DATE OF BIRTH:  1929-10-03   DATE OF ADMISSION:  05/23/2005  DATE OF DISCHARGE:                                HISTORY & PHYSICAL   [name] [name] is a 77 year old white man who is admitted to [facility] because of chest pain and ventricular tachycardia. While at home  this evening, the patient experienced the sudden onset of a rapid heart  beat. This was associated with an indigestion or fullness sensation in the  upper anterior chest. EMS was summoned and found him to be in ventricular  tachycardia at a rate of approximately 160 beats per minute. He received  intravenous Lidocaine and intravenous amiodarone which resulted in  cardioversion to normal sinus rhythm. He did not require a shock. With  resolution of the tachycardia, his chest discomfort resolved. He was  transported to the emergency department. At this time, the patient has no  further chest pain. While he experienced his rapid heart beat, he noted no  dizziness, lightheadedness, syncope or near syncope.   The patient has a history of an old anterior myocardial infarction. He has  never even been studied with a cardiac catheterization. This was apparently  a silent myocardial infarction. He has no history of congestive heart  failure or arrhythmia.   The patient's other medical problems include hypertension, dyslipidemia, a  stroke and a history of kidney, bladder and prostate cancer.   MEDICATIONS:  Furosemide, Lisinopril, metoprolol, and Coumadin.   ALLERGIES:  PENICILLIN.   OPERATIONS:  Nephrectomy and removal of bladder cancer.   SOCIAL HISTORY:  The patient is married and lives with his wife. No other  information is available at this time.   REVIEW OF SYSTEMS:  Could not be obtained as  the patient was taken  immediately to the cardiac catheterization lab.   PHYSICAL EXAMINATION:  Blood pressure 135/79, pulse 60 and regular,  respiration 20, temperature 97.3. The patient was an elderly white man in no  discomfort. He was alert, oriented, appropriate and responsive. Head, eyes,  nose, mouth were normal. The neck was without thyromegaly or adenopathy.  Carotid pulses were palpable bilaterally and without bruits. Cardiac  examination revealed a normal S1 and S2. There was no S3, S4, murmur, rub or  click. Cardiac rhythm was regular. No chest wall tenderness was noted. The  lungs were clear. The abdomen was soft and nontender. There was no mass,  hepatosplenomegaly, bruit, distension, rebound, guarding, or rigidity. Bowel  sounds were normal. Rectal and genital examinations were not performed as  they were not pertinent to the reason for acute care hospitalization. The  extremities were without edema, deviation or deformity. Radial and dorsalis  pedis pulses were palpable bilaterally. Brief screening neurologic survey  was unremarkable.   The electrocardiogram revealed normal sinus rhythm. There were anterior Q-  waves with ST segment elevation. There was T-wave inversion in the lateral  leads. The anterior Q-waves and ST segment elevation were present on his  electrocardiogram in April of 2006. The chest radiograph had not yet been  performed at the time of this dictation. Cardiac markers revealed a  myoglobin of 451, CK-MB of 3.1, and troponin 0.07. INR was 2.5. Potassium  was 4.0, BUN 24, and creatinine 1.2. White count was 7.8 with a hemoglobin  of 14.6 and hematocrit of 42.8. The remaining studies were pending at the  time of this dictation.   IMPRESSION:  1.  Chest pain, ventricular tachycardia:  Rule out an acute myocardial      infarction. The anterior Q-waves and ST segment elevation are chronic      and probably due to the earlier myocardial infarction.  2.   Coronary artery disease:  Status post silent anterior myocardial      infarction.  3.  Status post cerebrovascular accident.  4.  Hypertension.  5.  Dyslipidemia.  6.  Status post kidney, bladder, and prostate cancer.   Acute cardiac intervention has been requested by the emergency department  physician. The catheter lab team has been activated.      Ulyses Amor, MD  Electronically Signed     MSC/MEDQ  D:  05/24/2005  T:  05/24/2005  Job:  [id number]   cc:   Corky Crafts, MD  Fax: [phone]

## 2010-09-24 NOTE — Discharge Summary (Signed)
NAME:  [name] [name], [name] [name]                ACCOUNT NO.:  [id number]   MEDICAL RECORD NO.:  [id number]          PATIENT TYPE:  INP   LOCATION:  1401                         FACILITY:  [facility]   PHYSICIAN:  Elmore Guise., M.D.DATE OF BIRTH:  04/06/1930   DATE OF ADMISSION:  07/19/2005  DATE OF DISCHARGE:  07/21/2005                                 DISCHARGE SUMMARY   DISCHARGE DIAGNOSES:  1.  History of coronary artery disease.  2.  History of ventricular tachycardia status post ICD implant.  3.  Hypertension.  4.  Dyslipidemia.  5.  History of recurrent bladder tumor.  6.  History of renal cancer and prostate cancer (status post nephrectomy).   HISTORY OF PRESENT ILLNESS:  The patient is a very pleasant 77 year old  white male who was admitted for elective bladder procedure after bladder  cancer recurrence.   HOSPITAL COURSE:  The patient was admitted to telemetry monitoring. He was  placed on a heparin drip as a bridge for his Coumadin. His INR was 1.2 on  arrival. He underwent his procedure on July 20, 2005 and tolerated it well.  Post procedure, he was started back on heparin and given a higher dose of  Coumadin that evening. The next morning his blood work was stable, he was  having no further problems, no hematuria. He was stable to go home from a  urologic standpoint. Therefore we stopped his heparin, gave him 1 shot of  Lovenox. He was to increase his Coumadin for the next 48 hours and then go  back to his normal dosage. He will follow up with Dr. Reyes Ivan at [facility] in 1 month for office visit and Coumadin check.   DISCHARGE MEDICATIONS:  1.  Zocor 40 mg daily.  2.  Lopressor 75 mg b.i.d.  3.  Lisinopril 20 mg b.i.d.  4.  Lasix 20 mg daily.  5.  Felodipine 7.5 mg daily.  6.  Aspirin 81 mg daily.  7.  Coumadin 10 mg tonight and then 5 mg 5 times per week and 2.5 mg 2 times      per week.  8.  Canasa suppositories and GlycoLax once daily for bowel regimen.   He is to notify Dr. Earlene Plater with any urological problems. He will also call  Dr. Earlene Plater for the results of his pathology. He will return for routine  follow-up with Dr. Lady Deutscher at [facility] in 1 month. He is  to notify the office with any questions or concerns.     Elmore Guise., M.D.  Electronically Signed    TWK/MEDQ  D:  07/21/2005  T:  07/22/2005  Job:  [id number]

## 2010-09-24 NOTE — Discharge Summary (Signed)
NAME:  [name] [name], [name] [name]                ACCOUNT NO.:  [id number]   MEDICAL RECORD NO.:  [id number]          PATIENT TYPE:  INP   LOCATION:  1419                         FACILITY:  [facility]   PHYSICIAN:  Elmore Guise., M.D.DATE OF BIRTH:  April 04, 1930   DATE OF ADMISSION:  11/29/2005  DATE OF DISCHARGE:  11/30/2005                                 DISCHARGE SUMMARY   DISCHARGE DIAGNOSES:  1.  History of bladder cancer for bladder biopsy after intravesical      chemotherapy.  2.  History of coronary artery disease.  3.  History of ventricular tachycardia.  4.  History of mild left ventricular dysfunction.  5.  History of CVA and apical mural thrombus.  6.  History of dyslipidemia.   HISTORY OF PRESENT ILLNESS:  The patient is a very pleasant 77 year old  white male admitted for observation in and around the time of his bladder  biopsy.  He was recommended for cardiac evaluation because of his history of  ventricular arrhythmias.   HOSPITAL COURSE:  The patient was admitted to telemetry monitoring.  He had  no ventricular arrhythmias during his hospital stay.  He underwent his  bladder biopsy on the morning of 11/30/2005.  He tolerated this well.  He  was given one dose of Lovenox during his stay as a bridge for his Coumadin.  He was discharged home later that evening after appropriate observation.  His discharge medications are unchanged from admission.  His follow-up  appointment will be with Dr. Reyes Ivan in approximately 2 weeks for PT/INR as  well as defibrillator interrogation.  He is to call the office with any  questions or concerns.      Elmore Guise., M.D.  Electronically Signed     TWK/MEDQ  D:  12/02/2005  T:  12/02/2005  Job:  [id number]

## 2010-09-24 NOTE — Op Note (Signed)
NAME:  [name] [name], [name] [name]                ACCOUNT NO.:  [id number]   MEDICAL RECORD NO.:  [id number]          PATIENT TYPE:  INP   LOCATION:  1401                         FACILITY:  [facility]   PHYSICIAN:  Ronald L. Earlene Plater, M.D.  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   OPERATIVE PROCEDURE:  1.  Cystourethroscopy.  2.  Cold cup removal of bladder lesion.   SURGEON:  Gaynelle Arabian, MD   ANESTHESIA:  LMA.   ESTIMATED BLOOD LOSS:  Negligible.   TUBES:  None.   COMPLICATIONS:  None.   INDICATIONS FOR PROCEDURE:  [name] is a very nice 77 year old white male,  who presents having had a transitional cell carcinoma of the ureter,  undergoing nephroureterectomy in the past.  He has had multiple recurrent  bladder tumors which required intravesical treatment and on surveillance  cystourethroscopy was found to have several small lesions in the right  pelvic sidewall bladder wall.  After understanding risks. benefits, and  alternatives, he elected to proceed with removal.  He had to be admitted  preoperatively to control his anticoagulation by Dr. Reyes Ivan.   PROCEDURE IN DETAIL:  The patient was placed in a supine position.  After  proper LMA anesthesia, was placed in the dorsal lithotomy position and  prepped and draped with Betadine in a sterile fashion.  Cystourethroscopy  was performed with a 22.5 [place] Olympus panendoscope.  Bladder was  carefully inspected.  There was mild trilobar hypertrophy.  Efflux of clear  urine was noted from the solitary orifice, and no other lesions were noted  except in the right posterior bladder wall.  There were 3 small lesions  which appeared to be possibly early transitional cell carcinoma.  One was  approximately 3 mm, and the other 2 were 1 mm, and they were in  closed  proximity.  These were each removed in their entirety with cold cup biopsy  forceps, submitted to pathology.  The base was cauterized  with Bugbee coagulation cautery, and there was good hemostasis noted.  The  bladder was drained; the panendoscope was removed, the specimen submitted to  pathology.  The patient was taken to the recovery room stable.      Ronald L. Earlene Plater, M.D.  Electronically Signed     RLD/MEDQ  D:  07/20/2005  T:  07/21/2005  Job:  [id number]

## 2010-09-24 NOTE — Op Note (Signed)
   NAME:  [name], [name] [name]                          ACCOUNT NO.:  [id number]   MEDICAL RECORD NO.:  [id number]                   PATIENT TYPE:  AMB   LOCATION:  [facility]                                 FACILITY:  [facility]   PHYSICIAN:  Ronald L. Ovidio Hanger, M.D.           DATE OF BIRTH:  05/08/1930   DATE OF PROCEDURE:  02/12/2002  DATE OF DISCHARGE:                                 OPERATIVE REPORT   PREOPERATIVE DIAGNOSIS:  Probable recurrent bladder tumor.   POSTOPERATIVE DIAGNOSIS:   PROCEDURE:  Cystourethroscopy, cold cup removal of probable bladder tumor.   SURGEON:  Lucrezia Starch. Earlene Plater, M.D.   ANESTHESIA:  General laryngeal airway.   ESTIMATED BLOOD LOSS:  Negligible.   DRAINS:  None.   COMPLICATIONS:  None.   INDICATIONS:  This patient is a very nice 77 year old white male who is  status post x-ray therapy prostate cancer in 1995.  He had a transitional  cell carcinoma of the bladder and had BCG and subsequently also had a right  nephroureterectomy in 1992.  He has been followed up with surveillance  cystourethroscopy and at the surveillance cystourethroscopy, he was found to  have a punctate lesion in the right lateral bladder wall that was suspicious  for an early recurrent transitional cell carcinoma of the bladder.  Understanding risks, benefits and alternatives he elected to proceed with  cystoscopy and cold cup removal.   DESCRIPTION OF PROCEDURE:  The patient was placed in the supine position.  After proper general laryngeal airway anesthesia, he was placed in the  dorsal lithotomy position, prepped and draped with Betadine in a sterile  fashion.  Cystoscopy was performed with a 22.5 [place] Olympus panendoscope.  Utilizing the 12 and 70 degree lenses, the bladder was carefully inspected.  Efflux of clear urine was noted from the solitary left ureteral orifice.  No  other lesions were noted in the bladder except at the right lateral bladder  wall there was approximately a 2  mm exophytic lesion suspicious for an early  transitional cell carcinoma of the bladder.  Utilizing the cold cup forceps,  the specimen was submitted in total, and the base was cauterized with Bovie  coagulation cautery.  No other lesions were noted.  The bladder was drained.  The pan endoscope was removed, and the patient was taken to the recovery  room stable.                                               Ronald L. Ovidio Hanger, M.D.    RLD/MEDQ  D:  02/12/2002  T:  02/12/2002  Job:  [id number]

## 2010-09-24 NOTE — Consult Note (Signed)
NAMEBEHR, CISLO NO.:  [id number]   MEDICAL RECORD NO.:  [id number]          PATIENT TYPE:  INP   LOCATION:  2906                         FACILITY:  [facility]   PHYSICIAN:  Duke Salvia, M.D.  DATE OF BIRTH:  1929-07-22   DATE OF CONSULTATION:  09/19/2005  DATE OF DISCHARGE:                                   CONSULTATION   REFERRING PHYSICIAN:  Rosine Abe, M.D.   REASON FOR CONSULTATION:  Mr. Radigan is a 77 year old gentleman with a history  of remote MI about 10 years ago in the anterior distribution that left him  with near normal left ventricular function.  He has a long-standing history  of ventricular atrial ectopy and presented with sustained tachycardia in  January 2007.  This was felt to be ventricular in origin and he underwent  implantation of a St. Jude ICD.  He has not been seen by [place] since January,  that I can tell.  His device was reprogrammed in March.   He presents to hospital on Saturday, because of a series of ICD discharges,  one on Friday and three on Saturday.  He was admitted to hospital and has  ruled out for myocardial infarction.  He has been treated with IV amiodarone  with some recurrences, although these were successfully treated with cardiac  pacing.  We are asked to see him for assistance with managing his  ventricular arrhythmias.   As known, Mr. Rodriguez has known ischemic heart disease.  He was catheterized  last in January, where he was found to have a 90% LAD lesion, however, it  has observed a very small vessel that was not felt to be amenable to  intervention.  Medical therapy was recommended.   Functionally, the patient has had no significant recurrent chest pain.  He  has had no problems with his exercise capacity.  Over the last week, he has  noted more palpitations and more episodes of presyncope accompanying the  palpitations (I should note the patient has been driving).   As noted, he was admitted to the hospital  for the above.  He has continued  to have ventricular tachycardia, although none in the last 10 hours.   PAST MEDICAL HISTORY:  1.  Hypertension.  2.  Dyslipidemia.  3.  Malignancies.      1.  Cancer surgery, status post nephrectomy.      2.  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      3.  Prostate cancer.  4.  Prior history of stroke felt to be cardioembolic related to his previous      MI.  5.  PFO.   MEDICATIONS ON ADMISSION:  1.  Lisinopril 40.  2.  Felodipine 10.  3.  Furosemide 20.  4.  Warfarin.  5.  Simvastatin.  6.  Aspirin.  7.  Metoprolol 100 mg b.i.d.   ALLERGIES:  PENICILLIN.   SOCIAL HISTORY:  The patient is married.  He three sons who  are actively  involved in his care and he is retired from [employer].  He  does not use cigarettes, alcohol or recreational drugs.   REVIEW OF SYSTEMS:  Ongoing chemotherapy related to his bladder cancer and  is otherwise unremarkable across all organ systems.   PHYSICAL EXAMINATION:  GENERAL:  He is an elderly male appearing his stated  age of 77.  VITAL SIGNS:  Blood pressure 127/58, pulse 54.  HEENT:  Demonstrated no xanthomas.  Neck veins were flat.  Carotid upstrokes  brisk and full without bruits.  BACK:  Without kyphosis or scoliosis.  LUNGS:  Clear.  HEART:  Regular without murmurs or gallops.  ABDOMEN:  Soft with active bowel sounds.  No midline pulsation or  hepatomegaly.  EXTREMITIES:  Femoral pulses were 2+.  Distal pulses were intact.  There is  no clubbing, cyanosis or edema.  NEUROLOGIC:  Notable for mild right hand clumsiness.  SKIN:  Warm and dry.   LABORATORY DATA AND X-RAY FINDINGS:  Electrocardiogram dated May 12,  demonstrated sinus rhythm at 95 with intervals of 0.20/1.0/3.4 with rare  PVCs and no R waves across the anterior precordium.  Interrogation of his  St. Jude [phone] ICD demonstrated an R wave of 11 and impedence  of 5 at 20, 255  episodes of ventricular tachycardia with cycle lengths ranging from 335 to  385.  While not all of them were downloaded, all that we did see downloaded  have a similar morphology.  They were sometimes successfully treated with  anticardiac pacing and other times slowed by ATP and thus, going under the  detection rate.  Other times, it was not terminated by ATP and ultimately  being terminated with high voltage therapy with a shock impedence of 58  ohms.  The device was reprogrammed today to broaden the VT zones, establish  a third zone, increase the number of intervals to detect and also to add a  post therapy slowing rate so as to be able to catch VT that was slower.   Laboratories were unrevealing except for a potassium of 3.5.  Troponins  notably are normal.   IMPRESSION:  1.  Ventricular tachycardia, recurrent, monomorphic.  Cycle length 380-360      msec with multiple episodes (greater than 255) since March, with greater      than 60 over the last two days.  Therapies are as noted previously.  2.  Status post St. Jude implantable cardioverter-defibrillator for      sustained monomorphic ventricular tachycardia presenting in January      2007.  3.  Ischemic heart disease.      1.  Status post myocardial infarction.      2.  Ejection fraction of 45%.      3.  Not felt to be a revascularizable candidate with an left anterior          descending serving a very small vessel.  4.  Patent foramen ovale with some evidence of shunt.  5.  Cerebrovascular accident thought to be cardioembolic on Coumadin.  6.  Multiple cancers including kidney cancer, prostate cancer, bladder      cancer currently on chemotherapy.  7.  Presyncope associated with #1.  8.  Tendency towards bradycardia.   RECOMMENDATIONS:  Mr. Savitz is having frequent episodes of ventricular  tachycardia.  I agree with the use of amiodarone as a first effort to try and suppress his VT.  I have reviewed with  the patient  and his family the  potential benefits as well as the potential side effects of amiodarone  therapy.  I am concerned somewhat about the tendency towards bradycardia  with the combination of amiodarone and beta-blockers potentially running [place]  into that problem where by we might need ICD device upgrade.   In the event that medications are inadequate for suppression of ventricular  tachycardia given the stable nature of his VT, I think it would be  appropriate to consider RF catheter ablation for the malignant substrate.  I  reviewed this also with the patient and he would like to proceed with  medical therapy first with which I agree.  Based on the above, continue with  amiodarone, reprogram his ICD as done, currently shock therapies are  programmed off so we can potentially get a 12-lead of the ventricular  arrhythmia, consider nitrates for augmented anti-ischemic therapy.   Thank you for the consultation.           ______________________________  Duke Salvia, M.D.     SCK/MEDQ  D:  09/19/2005  T:  09/19/2005  Job:  [id number]

## 2010-09-24 NOTE — Op Note (Signed)
NAME:  [name] [name], [name] [name]                ACCOUNT NO.:  [id number]   MEDICAL RECORD NO.:  [id number]          PATIENT TYPE:  INP   LOCATION:  2906                         FACILITY:  [facility]   PHYSICIAN:  Judie Petit, M.D. DATE OF BIRTH:  11/10/1929   DATE OF PROCEDURE:  DATE OF DISCHARGE:                                 OPERATIVE REPORT   PROCEDURE:  Intubation.   The patient is a 77 year old male in ventricular tachycardia.  Systolic  blood pressure in the 70's. Called by Dr. Waldon Reining to intubate the patient  not responding to IV medications. Brief history obtained.  The patient awake  and talking.  Rapid frequent intubation with cricoid pressure utilized with  etomidate 8 mg and succinylcholine to 120 mg IV.  A #8 endotracheal tube was  placed by CRNA, Amy, on the first attempt.  Positive  end tidal CO2,  positive bilateral breath sounds.  Tube was secured at 22 cm at the lips.  Cardioversion was performed and successful.  Afterwards oxygen saturation  100%.  Presently the patient is stable with 134/76 blood pressure.  Heart  rate at 56.  The patient was extubated without difficulty.  The patient is  presently talking and alert.           ______________________________  Judie Petit, M.D.     CE/MEDQ  D:  09/19/2005  T:  09/19/2005  Job:  [id number]

## 2010-09-28 ENCOUNTER — Telehealth: Payer: Self-pay | Admitting: Cardiology

## 2010-09-28 NOTE — Telephone Encounter (Signed)
Patient is taking cipro for insect bites on his arm.  He just wanted to make sure that Tresa Endo knew that he has been scheduled for his next PT 4 MONTHS from now.  He wondered if he needed to come back sooner.

## 2010-09-28 NOTE — Telephone Encounter (Signed)
Returned phone call to pt about coumadin recheck and antibiotic therapy. Pt was seen yesterday at doctor's office. He was prescribed Cipro 500mg  BID. And Clobetosone 0.05% topical cream. He has had 2 doses of Cipro and has had some improvement of condition of left arm which he reports was red, itching, swollen and hot yesterday.  Reviewed with Dr Deborah Chalk and INR should be check this week.

## 2010-09-29 ENCOUNTER — Ambulatory Visit (INDEPENDENT_AMBULATORY_CARE_PROVIDER_SITE_OTHER): Payer: Medicare Other | Admitting: *Deleted

## 2010-09-29 DIAGNOSIS — R0989 Other specified symptoms and signs involving the circulatory and respiratory systems: Secondary | ICD-10-CM

## 2010-09-29 DIAGNOSIS — I472 Ventricular tachycardia: Secondary | ICD-10-CM

## 2010-09-29 DIAGNOSIS — I4891 Unspecified atrial fibrillation: Secondary | ICD-10-CM

## 2010-10-20 ENCOUNTER — Ambulatory Visit (INDEPENDENT_AMBULATORY_CARE_PROVIDER_SITE_OTHER): Payer: Medicare Other | Admitting: *Deleted

## 2010-10-20 DIAGNOSIS — I472 Ventricular tachycardia, unspecified: Secondary | ICD-10-CM

## 2010-10-20 LAB — POCT INR: INR: 2.1

## 2010-11-16 ENCOUNTER — Ambulatory Visit (INDEPENDENT_AMBULATORY_CARE_PROVIDER_SITE_OTHER): Payer: Medicare Other | Admitting: *Deleted

## 2010-11-16 DIAGNOSIS — I472 Ventricular tachycardia: Secondary | ICD-10-CM

## 2010-11-16 LAB — POCT INR: INR: 2.1

## 2010-11-17 ENCOUNTER — Encounter: Payer: Self-pay | Admitting: Cardiology

## 2010-11-17 ENCOUNTER — Encounter: Payer: Self-pay | Admitting: *Deleted

## 2010-12-15 ENCOUNTER — Ambulatory Visit (INDEPENDENT_AMBULATORY_CARE_PROVIDER_SITE_OTHER): Payer: Medicare Other | Admitting: *Deleted

## 2010-12-15 DIAGNOSIS — I4891 Unspecified atrial fibrillation: Secondary | ICD-10-CM

## 2010-12-15 DIAGNOSIS — Z7901 Long term (current) use of anticoagulants: Secondary | ICD-10-CM

## 2010-12-15 LAB — POCT INR: INR: 2.8

## 2011-01-05 ENCOUNTER — Ambulatory Visit (INDEPENDENT_AMBULATORY_CARE_PROVIDER_SITE_OTHER): Payer: Medicare Other | Admitting: *Deleted

## 2011-01-05 ENCOUNTER — Encounter: Payer: Self-pay | Admitting: Internal Medicine

## 2011-01-05 DIAGNOSIS — Z95810 Presence of automatic (implantable) cardiac defibrillator: Secondary | ICD-10-CM

## 2011-01-05 DIAGNOSIS — I4949 Other premature depolarization: Secondary | ICD-10-CM

## 2011-01-05 DIAGNOSIS — I4891 Unspecified atrial fibrillation: Secondary | ICD-10-CM

## 2011-01-05 DIAGNOSIS — Z7901 Long term (current) use of anticoagulants: Secondary | ICD-10-CM | POA: Insufficient documentation

## 2011-01-05 DIAGNOSIS — I472 Ventricular tachycardia: Secondary | ICD-10-CM

## 2011-01-05 DIAGNOSIS — I493 Ventricular premature depolarization: Secondary | ICD-10-CM

## 2011-01-05 LAB — ICD DEVICE OBSERVATION
DEVICE MODEL ICD: 347221
TOT-0006: 20120523000000
TOT-0007: 2
TOT-0008: 0
TOT-0009: 6
TOT-0010: 38
TZAT-0001FASTVT: 1
TZAT-0004FASTVT: 8
TZAT-0004SLOWVT: 8
TZAT-0012FASTVT: 200 ms
TZAT-0013FASTVT: 1
TZAT-0019FASTVT: 7.5 V
TZAT-0020FASTVT: 1 ms
TZON-0004FASTVT: 12
TZON-0005FASTVT: 6
TZON-0010SLOWVT: 80 ms
TZST-0001FASTVT: 3
TZST-0001FASTVT: 4
TZST-0003FASTVT: 22.5 J
TZST-0003FASTVT: 36 J
TZST-0003FASTVT: 36 J
VENTRICULAR PACING ICD: 1 pct

## 2011-01-05 LAB — POCT INR: INR: 3.3

## 2011-01-05 NOTE — Progress Notes (Signed)
icd check in clinic  

## 2011-01-21 ENCOUNTER — Telehealth: Payer: Self-pay | Admitting: *Deleted

## 2011-01-21 DIAGNOSIS — T82198A Other mechanical complication of other cardiac electronic device, initial encounter: Secondary | ICD-10-CM

## 2011-01-21 NOTE — Telephone Encounter (Signed)
Checking lead 

## 2011-02-02 ENCOUNTER — Ambulatory Visit (INDEPENDENT_AMBULATORY_CARE_PROVIDER_SITE_OTHER)
Admission: RE | Admit: 2011-02-02 | Discharge: 2011-02-02 | Disposition: A | Discharge status: Home / Self Care | Payer: Medicare Other | Source: Ambulatory Visit | Attending: Internal Medicine | Admitting: Internal Medicine

## 2011-02-02 ENCOUNTER — Ambulatory Visit (INDEPENDENT_AMBULATORY_CARE_PROVIDER_SITE_OTHER): Payer: Medicare Other | Admitting: *Deleted

## 2011-02-02 DIAGNOSIS — I4891 Unspecified atrial fibrillation: Secondary | ICD-10-CM

## 2011-02-02 DIAGNOSIS — T82198A Other mechanical complication of other cardiac electronic device, initial encounter: Secondary | ICD-10-CM

## 2011-02-02 DIAGNOSIS — Z7901 Long term (current) use of anticoagulants: Secondary | ICD-10-CM

## 2011-02-02 LAB — POCT INR: INR: 2.8

## 2011-02-09 ENCOUNTER — Encounter: Payer: Self-pay | Admitting: Cardiology

## 2011-02-15 ENCOUNTER — Other Ambulatory Visit: Payer: Self-pay | Admitting: Cardiology

## 2011-03-01 ENCOUNTER — Telehealth: Payer: Self-pay | Admitting: Cardiology

## 2011-03-01 ENCOUNTER — Encounter: Payer: Self-pay | Admitting: Nurse Practitioner

## 2011-03-01 ENCOUNTER — Ambulatory Visit (INDEPENDENT_AMBULATORY_CARE_PROVIDER_SITE_OTHER): Payer: Medicare Other | Admitting: Nurse Practitioner

## 2011-03-01 VITALS — BP 118/60 | HR 79 | Ht 73.0 in | Wt 219.1 lb

## 2011-03-01 DIAGNOSIS — I2510 Atherosclerotic heart disease of native coronary artery without angina pectoris: Secondary | ICD-10-CM

## 2011-03-01 DIAGNOSIS — I4891 Unspecified atrial fibrillation: Secondary | ICD-10-CM

## 2011-03-01 NOTE — Telephone Encounter (Signed)
Pt heart is out of rhythm and he wants to be seen today no other symptoms

## 2011-03-01 NOTE — Assessment & Plan Note (Signed)
We will update his echo. He does not appear volume overloaded at this time.

## 2011-03-01 NOTE — Progress Notes (Signed)
[name] [name] Date of Birth: Dec 19, 1929 Medical Record #[id number]  History of Present Illness: [name] [name] is seen today for a work in visit. He is a former patient of Dr. Ronnald Nian. He is to see Dr. Shirlee Latch to establish his cardiology care next week. He has a very complex medical history. This includes poor LV function, remote anterior MI in 1995 and no early revascularization. He has had a mural apical thrombus noted since 2006 when he presented with a stroke. His last stress test was in June of 2009 and showed a fixed anterior apical defect with an EF of 36%. Other problems include ventricular tachycardia and PAF. He has a single chamber ICD in place since 2007. 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 He has not been able to tolerate Multaq and has a low EF. He has been on Sotalol since February of 2010 for his PAF. Other problems include HTN, diabetes, obesity, hyperlipidemia, GU cancers including right nephrectomy, prostate cancer and bladder cancers. He remains on chronic coumadin and last INR was 2.8. He does have documented PFO.   He comes in today and notes that he thinks he is back out of rhythm. His wife of 55 years died last month. He was the sole caregiver for the last 10 years. He is heart broken. He is very tearful. He is trying to get out some but admits that her death is very hard for him. Over the last couple of days he noted some palpitations. He says he felt "bad" last night but really can't be specific. He is not lightheaded or dizzy. No syncope. No ICD shocks. He is not short of breath. His swelling has actually improved since the death of his wife because he is not on his feet as much. No chest pain. He admits that he may just feel bad due to his grief. He did take an extra half of Sotalol last night.   Current Outpatient Prescriptions on File Prior to Visit  Medication Sig Dispense Refill  . amLODipine (NORVASC)  5 MG tablet TAKE ONE TABLET BY MOUTH TWICE DAILY  180 tablet  3  . aspirin 81 MG tablet Take 81 mg by mouth daily.        . DiphenhydrAMINE HCl (DIPHEN PO) Take by mouth as needed.        . furosemide (LASIX) 40 MG tablet TAKE ONE TABLET BY MOUTH EVERY DAY  90 tablet  3  . lisinopril (PRINIVIL,ZESTRIL) 20 MG tablet TAKE ONE TABLET BY MOUTH EVERY DAY  90 tablet  3  . simvastatin (ZOCOR) 40 MG tablet Take 40 mg by mouth daily.        . sotalol (BETAPACE) 160 MG tablet Take 160 mg by mouth 2 (two) times daily.        Marland Kitchen warfarin (COUMADIN) 5 MG tablet Take 5 mg by mouth daily. AS DIRECTED.  2.5MG  FOUR DAYS A WEEK 5MG  THREE TIMES A WEEK         Allergies  Allergen Reactions  . Penicillins     Past Medical History  Diagnosis Date  . PVCs (premature ventricular contractions)   . Heart palpitations   . Coronary artery disease   . Hypertension   . Atrial fibrillation   . Anxiety     CHRONIC  . Myocardial infarction 1995    remote anterior   . Ventricular tachycardia   . Bladder cancer   .  Hyperthyroidism   . Patent foramen ovale   . Mural thrombus of heart   . Genitourinary disease     CANCER  . Hyperlipidemia   . Stroke   . Left ventricular dysfunction     Past Surgical History  Procedure Date  . Icd 2007  . Kidney surgery     REMOVED  . Bladder     PART OF IT REMOVED  . Cardiac catheterization   . Prostatectomy     TOTAL  . Eye surgery 2000    CATARACT BOTH EYES  . Hernia repair 1956    History  Smoking status  . Former Smoker  . Quit date: 05/09/1990  Smokeless tobacco  . Not on file    History  Alcohol Use No    Family History  Problem Relation Age of Onset  . Heart attack Father     Review of Systems: The review of systems is per the HPI.  All other systems were reviewed and are negative.  Physical Exam: BP 118/60  Pulse 79  Ht 6\' 1"  (1.854 m)  Wt 219 lb 1.9 oz (99.392 kg)  BMI 28.91 kg/m2 Patient is very pleasant and in no acute distress.  He is quite tearful. Skin is warm and dry. Color is normal.  HEENT is unremarkable. Normocephalic/atraumatic. PERRL. Sclera are nonicteric. Neck is supple. No masses. No JVD. Lungs are clear. Cardiac exam shows an irregular rhythm. Rate is controlled. Abdomen is soft. Extremities are with trace edema. Gait and ROM are intact. No gross neurologic deficits noted.   LABORATORY DATA: EKG shows atrial fib with controlled ventricular response.    Assessment / Plan:

## 2011-03-01 NOTE — Patient Instructions (Addendum)
We are going to check some labs today.  We are going to get an ultrasound of your heart this week.   Keep your appointment with Dr. Shirlee Latch for October 31st. We will be able to review your test results and may set up a cardioversion at that visit time. If you continue to feel bad, let us know and we may have to proceed with the cardioversion sooner.   Continue with your current medicines. We are not going to make any changes.

## 2011-03-01 NOTE — Assessment & Plan Note (Signed)
Has had remote MI without early revascularization. Last nuclear study found was in June of 2009 showing fixed anterior apical defect with EF of 36%. Currently without chest pain.

## 2011-03-01 NOTE — Assessment & Plan Note (Addendum)
He is in recurrent atrial fib. He remains on Coumadin.  He is already on 160 mg of Sotalol BID. I have discussed his case with Dr. Graciela Husbands. We both do not feel like we can push that dose further. We will update his echo and check baseline labs today. I do not get the feeling that he is very symptomatic. I think he is grieving more. We will obtain the echo and labs. I will have him keep his appointment with Dr. Shirlee Latch for next Wednesday to review those studies. He has been therapeutic on his coumadin and we could proceed on with cardioversion. Patient is agreeable to this plan and will call if any problems develop in the interim.

## 2011-03-01 NOTE — Telephone Encounter (Signed)
[name] [name] is requesting an appt today.  He states his heart has been skipping beats for the past 2-3 days.  He states it has been very constant since yesterday and didn't sleep much last night.  He states he has a history of a fib.

## 2011-03-02 LAB — CBC WITH DIFFERENTIAL/PLATELET
Basophils Absolute: 0.2 10*3/uL — ABNORMAL HIGH (ref 0.0–0.1)
Basophils Relative: 1.5 % (ref 0.0–3.0)
Eosinophils Absolute: 0.1 10*3/uL (ref 0.0–0.7)
Eosinophils Relative: 0.6 % (ref 0.0–5.0)
HCT: 48 % (ref 39.0–52.0)
Hemoglobin: 16.2 g/dL (ref 13.0–17.0)
Lymphocytes Relative: 15.9 % (ref 12.0–46.0)
Lymphs Abs: 1.6 10*3/uL (ref 0.7–4.0)
MCHC: 33.8 g/dL (ref 30.0–36.0)
MCV: 95.2 fl (ref 78.0–100.0)
Monocytes Absolute: 0.6 10*3/uL (ref 0.1–1.0)
Monocytes Relative: 6 % (ref 3.0–12.0)
Neutro Abs: 7.9 10*3/uL — ABNORMAL HIGH (ref 1.4–7.7)
Neutrophils Relative %: 76 % (ref 43.0–77.0)
Platelets: 215 10*3/uL (ref 150.0–400.0)
RBC: 5.04 Mil/uL (ref 4.22–5.81)
RDW: 14.3 % (ref 11.5–14.6)
WBC: 10.4 10*3/uL (ref 4.5–10.5)

## 2011-03-02 LAB — BASIC METABOLIC PANEL
BUN: 20 mg/dL (ref 6–23)
CO2: 25 mEq/L (ref 19–32)
Calcium: 9.4 mg/dL (ref 8.4–10.5)
Chloride: 103 mEq/L (ref 96–112)
Creatinine, Ser: 1.2 mg/dL (ref 0.4–1.5)
GFR: 60.82 mL/min (ref >60.00)
Glucose, Bld: 139 mg/dL — ABNORMAL HIGH (ref 70–99)
Potassium: 4.4 mEq/L (ref 3.5–5.1)
Sodium: 139 mEq/L (ref 135–145)

## 2011-03-02 LAB — TSH: TSH: 2.34 u[IU]/mL (ref 0.35–5.50)

## 2011-03-03 ENCOUNTER — Ambulatory Visit (HOSPITAL_COMMUNITY): Payer: Medicare Other | Attending: Cardiology

## 2011-03-03 DIAGNOSIS — I4891 Unspecified atrial fibrillation: Secondary | ICD-10-CM

## 2011-03-03 DIAGNOSIS — I059 Rheumatic mitral valve disease, unspecified: Secondary | ICD-10-CM | POA: Insufficient documentation

## 2011-03-03 DIAGNOSIS — I079 Rheumatic tricuspid valve disease, unspecified: Secondary | ICD-10-CM | POA: Insufficient documentation

## 2011-03-03 DIAGNOSIS — I252 Old myocardial infarction: Secondary | ICD-10-CM | POA: Insufficient documentation

## 2011-03-06 NOTE — Progress Notes (Signed)
Agree with note.  Will likely plan cardioversion if he remains in atrial fibrillation.

## 2011-03-09 ENCOUNTER — Encounter: Payer: Self-pay | Admitting: Cardiology

## 2011-03-09 ENCOUNTER — Ambulatory Visit (INDEPENDENT_AMBULATORY_CARE_PROVIDER_SITE_OTHER): Payer: Medicare Other | Admitting: Cardiology

## 2011-03-09 ENCOUNTER — Ambulatory Visit (INDEPENDENT_AMBULATORY_CARE_PROVIDER_SITE_OTHER): Payer: Medicare Other | Admitting: *Deleted

## 2011-03-09 DIAGNOSIS — I2510 Atherosclerotic heart disease of native coronary artery without angina pectoris: Secondary | ICD-10-CM

## 2011-03-09 DIAGNOSIS — E785 Hyperlipidemia, unspecified: Secondary | ICD-10-CM

## 2011-03-09 DIAGNOSIS — I4891 Unspecified atrial fibrillation: Secondary | ICD-10-CM

## 2011-03-09 DIAGNOSIS — I509 Heart failure, unspecified: Secondary | ICD-10-CM

## 2011-03-09 DIAGNOSIS — I5022 Chronic systolic (congestive) heart failure: Secondary | ICD-10-CM

## 2011-03-09 DIAGNOSIS — Z7901 Long term (current) use of anticoagulants: Secondary | ICD-10-CM

## 2011-03-09 MED ORDER — SPIRONOLACTONE 25 MG PO TABS: ORAL_TABLET | ORAL | Status: DC

## 2011-03-09 NOTE — Patient Instructions (Signed)
Start spironolactone 12.5mg  daily--this will be one-half of a 25mg  tablet daily.  Your physician recommends that you return for a FASTING lipid profile /BMP/Liver profile 427.31  428.22 --in 2 weeks   Your physician recommends that you schedule a follow-up appointment in: 3 months with Dr Shirlee Latch.

## 2011-03-10 DIAGNOSIS — I5022 Chronic systolic (congestive) heart failure: Secondary | ICD-10-CM | POA: Insufficient documentation

## 2011-03-10 NOTE — Assessment & Plan Note (Signed)
Stable with no chest pain.  Continue ASA 81, lisinopril, and statin.  She is on sotalol as a beta blocker.

## 2011-03-10 NOTE — Assessment & Plan Note (Signed)
Check lipids/LFTs, goal LDL < 70.  

## 2011-03-10 NOTE — Assessment & Plan Note (Signed)
Paroxysmal.  He is back in NSR today.  Atrial fibrillation at prior appointment was likely related to stress of wife's death.  QT interval is normal on sotalol.  Will continue sotalol to maintain NSR and warfarin.

## 2011-03-10 NOTE — Assessment & Plan Note (Signed)
NYHA class II symptoms without significant volume overload.  Continue lisinopril and sotalol.  I will add spironolactone 12.5 mg daily.  BMET in 2 weeks.

## 2011-03-10 NOTE — Progress Notes (Signed)
PCP: Dr. Manus Gunning  77 yo with history of CAD, ischemic cardiomyopathy, and paroxysmal atrial fibrillation presents for cardiology followup.  He is a former patient of Dr. Deborah Chalk who is seeing me for the first time today.  Mr Bells has had a hard last few weeks.  His wife died in Jan 30, 2023 after long period of Alzheimers dementia.  He was seen last week by Norma Fredrickson and was noted to be back in atrial fibrillation.  He was continued on sotalol and set up for appointment today to arrange cardioversion if he remained in atrial fibrillation.  Today, he seems to be doing better.  He is back in sinus rhythm.  He thinks his heart has been back in rhythm for about a week.  He reports no dypsnea walking on flat ground (some shortness of breath with stairs).  No chest pain.  He is still grieving.    ECG: NSR, narrow QRS and normal QT interval, 1st degree AV block (230 msec), LAFB, old ASMI with residual anterior ST elevation suggestive of possible LV aneurysm.   Labs (4/12): LDL 56, HDL 31 Labs (10/12): K 4.4, creatinine 1.2, TSH normal, HCT 48  PMH: 1.  CAD: Anterior MI 1995.  Myoview (6/09) with fixed anterior and apical defect, EF 36%.  2.  LV mural thrombus noted in 2006.  Patient had CVA related to this.  He is on chronic coumadin.  3.  Ischemic cardiomyopathy: Echo (10/12) with EF 30-35%, mild LV hypertrophy, apical septal and anterior akinesis, no LV thrombus seen, mild MR.  Patient has a Secondary school teacher ICD.  4.  H/o VT 5.  Prostate cancer s/p treatment 6.  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  S/p multiple surgeries for excision and right nephrectomy.  7.  PFO 8.  Atrial fibrillation: Paroxysmal.  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   9.  HTN 10. Hyperlipidemia 11. Diabetes mellitus   SH: Retired Tax adviser for [employer].  Lives in [place].  Widower.  Has 3 sons.  Quit smoking in 1991.   FH: Noncontributory.   ROS: All systems reviewed and negative  except as per HPI.   Current Outpatient Prescriptions  Medication Sig Dispense Refill  . amLODipine (NORVASC) 5 MG tablet TAKE ONE TABLET BY MOUTH TWICE DAILY  180 tablet  3  . aspirin 81 MG tablet Take 81 mg by mouth daily.        . DiphenhydrAMINE HCl (DIPHEN PO) Take by mouth as needed.        . furosemide (LASIX) 40 MG tablet TAKE ONE TABLET BY MOUTH EVERY DAY  90 tablet  3  . lisinopril (PRINIVIL,ZESTRIL) 20 MG tablet TAKE ONE TABLET BY MOUTH EVERY DAY  90 tablet  3  . simvastatin (ZOCOR) 40 MG tablet Take 40 mg by mouth daily.        . sotalol (BETAPACE) 160 MG tablet Take 160 mg by mouth 2 (two) times daily.        Marland Kitchen warfarin (COUMADIN) 5 MG tablet Take 5 mg by mouth daily. AS DIRECTED.  2.5MG  FOUR DAYS A WEEK 5MG  THREE TIMES A WEEK       . spironolactone (ALDACTONE) 25 MG tablet Take 1/2 tablet daily  45 tablet  3    BP 130/80  Pulse 70  Ht 6\' 1"  (1.854 m)  Wt 224 lb (101.606 kg)  BMI 29.55 kg/m2 General: NAD Neck: No JVD, no thyromegaly or thyroid nodule.  Lungs: Clear to  auscultation bilaterally with normal respiratory effort. CV: Nondisplaced PMI.  Heart regular S1/S2, no S3/S4, 1/6 SEM.  1+ ankle edema.  No carotid bruit.  Normal pedal pulses.  Abdomen: Soft, nontender, no hepatosplenomegaly, no distention.  Neurologic: Alert and oriented x 3.  Psych: Normal affect. Extremities: No clubbing or cyanosis.

## 2011-03-23 ENCOUNTER — Other Ambulatory Visit: Payer: Medicare Other | Admitting: *Deleted

## 2011-03-28 ENCOUNTER — Other Ambulatory Visit (INDEPENDENT_AMBULATORY_CARE_PROVIDER_SITE_OTHER): Payer: Medicare Other | Admitting: *Deleted

## 2011-03-28 DIAGNOSIS — I4891 Unspecified atrial fibrillation: Secondary | ICD-10-CM

## 2011-03-28 DIAGNOSIS — I2510 Atherosclerotic heart disease of native coronary artery without angina pectoris: Secondary | ICD-10-CM

## 2011-03-28 LAB — BASIC METABOLIC PANEL
BUN: 17 mg/dL (ref 6–23)
CO2: 26 mEq/L (ref 19–32)
Calcium: 8.6 mg/dL (ref 8.4–10.5)
Creatinine, Ser: 1.2 mg/dL (ref 0.4–1.5)
GFR: 61.39 mL/min (ref >60.00)
Glucose, Bld: 151 mg/dL — ABNORMAL HIGH (ref 70–99)

## 2011-03-28 LAB — LIPID PANEL
Cholesterol: 110 mg/dL (ref 0–200)
HDL: 30.7 mg/dL — ABNORMAL LOW (ref >39.00)
Triglycerides: 130 mg/dL (ref 0.0–149.0)

## 2011-03-28 LAB — HEPATIC FUNCTION PANEL
Albumin: 3.6 g/dL (ref 3.5–5.2)
Alkaline Phosphatase: 63 U/L (ref 39–117)
Total Protein: 7.1 g/dL (ref 6.0–8.3)

## 2011-03-29 ENCOUNTER — Other Ambulatory Visit: Payer: Self-pay | Admitting: Cardiology

## 2011-04-05 ENCOUNTER — Other Ambulatory Visit: Payer: Self-pay | Admitting: Cardiology

## 2011-04-06 ENCOUNTER — Ambulatory Visit (INDEPENDENT_AMBULATORY_CARE_PROVIDER_SITE_OTHER): Payer: Medicare Other | Admitting: *Deleted

## 2011-04-06 DIAGNOSIS — Z7901 Long term (current) use of anticoagulants: Secondary | ICD-10-CM

## 2011-04-06 DIAGNOSIS — I4891 Unspecified atrial fibrillation: Secondary | ICD-10-CM

## 2011-04-13 ENCOUNTER — Ambulatory Visit (INDEPENDENT_AMBULATORY_CARE_PROVIDER_SITE_OTHER): Payer: Medicare Other | Admitting: *Deleted

## 2011-04-13 ENCOUNTER — Encounter: Payer: Self-pay | Admitting: Internal Medicine

## 2011-04-13 DIAGNOSIS — I472 Ventricular tachycardia: Secondary | ICD-10-CM

## 2011-04-13 DIAGNOSIS — I4891 Unspecified atrial fibrillation: Secondary | ICD-10-CM

## 2011-04-13 DIAGNOSIS — Z95810 Presence of automatic (implantable) cardiac defibrillator: Secondary | ICD-10-CM

## 2011-04-13 LAB — ICD DEVICE OBSERVATION
BRDY-0002RV: 45 {beats}/min
CHARGE TIME: 12.6 s
HV IMPEDENCE: 53 Ohm
RV LEAD IMPEDENCE ICD: 570 Ohm
RV LEAD THRESHOLD: 0.5 V
TOT-0009: 6
TZAT-0001FASTVT: 1
TZAT-0013SLOWVT: 8
TZAT-0018SLOWVT: NEGATIVE
TZAT-0019FASTVT: 7.5 V
TZAT-0020FASTVT: 1 ms
TZAT-0020SLOWVT: 1 ms
TZON-0003SLOWVT: 460 ms
TZON-0004FASTVT: 12
TZON-0004SLOWVT: 16
TZON-0005FASTVT: 6
TZON-0005SLOWVT: 6
TZON-0010FASTVT: 80 ms
TZST-0001FASTVT: 4
TZST-0003FASTVT: 22.5 J
TZST-0003FASTVT: 36 J
VENTRICULAR PACING ICD: 1 pct

## 2011-04-13 NOTE — Progress Notes (Signed)
icd check in clinic  

## 2011-05-04 ENCOUNTER — Ambulatory Visit (INDEPENDENT_AMBULATORY_CARE_PROVIDER_SITE_OTHER): Payer: Medicare Other | Admitting: *Deleted

## 2011-05-04 DIAGNOSIS — I4891 Unspecified atrial fibrillation: Secondary | ICD-10-CM

## 2011-05-04 DIAGNOSIS — Z7901 Long term (current) use of anticoagulants: Secondary | ICD-10-CM

## 2011-05-04 LAB — POCT INR: INR: 2.6

## 2011-06-01 ENCOUNTER — Ambulatory Visit (INDEPENDENT_AMBULATORY_CARE_PROVIDER_SITE_OTHER): Payer: Medicare Other | Admitting: Pharmacist

## 2011-06-01 DIAGNOSIS — Z7901 Long term (current) use of anticoagulants: Secondary | ICD-10-CM

## 2011-06-01 DIAGNOSIS — I4891 Unspecified atrial fibrillation: Secondary | ICD-10-CM

## 2011-06-21 ENCOUNTER — Encounter: Payer: Self-pay | Admitting: Internal Medicine

## 2011-06-21 ENCOUNTER — Ambulatory Visit (INDEPENDENT_AMBULATORY_CARE_PROVIDER_SITE_OTHER): Payer: Medicare Other | Admitting: Internal Medicine

## 2011-06-21 DIAGNOSIS — I5022 Chronic systolic (congestive) heart failure: Secondary | ICD-10-CM

## 2011-06-21 DIAGNOSIS — Z95810 Presence of automatic (implantable) cardiac defibrillator: Secondary | ICD-10-CM

## 2011-06-21 DIAGNOSIS — I472 Ventricular tachycardia: Secondary | ICD-10-CM

## 2011-06-21 DIAGNOSIS — I509 Heart failure, unspecified: Secondary | ICD-10-CM

## 2011-06-21 LAB — ICD DEVICE OBSERVATION
CHARGE TIME: 0 s
RV LEAD IMPEDENCE ICD: 550 Ohm
TOT-0006: 20121205000000
TOT-0009: 6
TOT-0010: 44
TZAT-0001FASTVT: 1
TZAT-0004SLOWVT: 8
TZAT-0012FASTVT: 200 ms
TZAT-0013FASTVT: 1
TZAT-0013SLOWVT: 8
TZAT-0018FASTVT: NEGATIVE
TZAT-0018SLOWVT: NEGATIVE
TZAT-0019FASTVT: 7.5 V
TZAT-0020FASTVT: 1 ms
TZON-0003FASTVT: 315 ms
TZON-0003SLOWVT: 460 ms
TZON-0004FASTVT: 12
TZON-0005FASTVT: 6
TZON-0010SLOWVT: 80 ms
TZST-0001FASTVT: 4
TZST-0003FASTVT: 36 J
TZST-0003FASTVT: 36 J
VF: 1

## 2011-06-21 NOTE — Assessment & Plan Note (Signed)
He has had one episode of ventricular tachycardia. He will continue his current dose of sotalol. Eventually, his dose will need to be readjusted based on his creatinine clearance.

## 2011-06-21 NOTE — Patient Instructions (Addendum)
Your physician recommends that you schedule a follow-up appointment in: 3 months with device clinic and 12 months with Dr Taylor  

## 2011-06-21 NOTE — Progress Notes (Signed)
HPI Mr. Mcloud returns today for followup. He is a very pleasant 78 year old man with coronary artery disease status post MI, chronic systolic heart failure, ventricular tachycardia, status post ICD implantation. In the interim, he has done well. His wife recently passed away after a long bout of dementia. He notes that he has gained weight, and overall feels well. He denies chest pain, shortness of breath, or peripheral edema. Allergies  Allergen Reactions  . Penicillins      Current Outpatient Prescriptions  Medication Sig Dispense Refill  . amLODipine (NORVASC) 5 MG tablet TAKE ONE TABLET BY MOUTH TWICE DAILY  180 tablet  3  . aspirin 81 MG tablet Take 81 mg by mouth daily.        . DiphenhydrAMINE HCl (DIPHEN PO) Take by mouth as needed.        . furosemide (LASIX) 40 MG tablet TAKE ONE TABLET BY MOUTH EVERY DAY  90 tablet  3  . lisinopril (PRINIVIL,ZESTRIL) 20 MG tablet TAKE ONE TABLET BY MOUTH EVERY DAY  90 tablet  3  . simvastatin (ZOCOR) 40 MG tablet Take 40 mg by mouth daily.        . sotalol (BETAPACE) 160 MG tablet TAKE ONE TABLET BY MOUTH TWICE DAILY  180 tablet  3  . spironolactone (ALDACTONE) 25 MG tablet Take 1/2 tablet daily  45 tablet  3  . warfarin (COUMADIN) 5 MG tablet TAKE AS DIRECTED  90 tablet  0     Past Medical History  Diagnosis Date  . PVCs (premature ventricular contractions)   . Heart palpitations   . Coronary artery disease   . Hypertension   . Atrial fibrillation   . Anxiety     CHRONIC  . Myocardial infarction 1995    remote anterior   . Ventricular tachycardia   . Bladder cancer   . Hyperthyroidism   . Patent foramen ovale   . Mural thrombus of heart   . Genitourinary disease     CANCER  . Hyperlipidemia   . Stroke   . Left ventricular dysfunction     ROS:   All systems reviewed and negative except as noted in the HPI.   Past Surgical History  Procedure Date  . Icd 2007  . Kidney surgery     REMOVED  . Bladder     PART OF IT  REMOVED  . Cardiac catheterization   . Prostatectomy     TOTAL  . Eye surgery 2000    CATARACT BOTH EYES  . Hernia repair 1956     Family History  Problem Relation Age of Onset  . Heart attack Father      History   Social History  . Marital Status: Married    Spouse Name: N/A    Number of Children: N/A  . Years of Education: N/A   Occupational History  . Not on file.   Social History Main Topics  . Smoking status: Former Smoker    Quit date: 05/09/1990  . Smokeless tobacco: Not on file  . Alcohol Use: No  . Drug Use: No  . Sexually Active: No   Other Topics Concern  . Not on file   Social History Narrative  . No narrative on file     BP 134/76  Pulse 64  Ht 6\' 1"  (1.854 m)  Wt 107.049 kg (236 lb)  BMI 31.14 kg/m2  Physical Exam:  Well appearing elderly man, NAD HEENT: Unremarkable Neck:  No JVD, no thyromegally Lungs:  Clear with no wheezes, rales, or rhonchi. Well-healed ICD incision. HEART:  Regular rate rhythm, no murmurs, no rubs, no clicks Abd:  soft, positive bowel sounds, no organomegally, no rebound, no guarding Ext:  2 plus pulses, no edema, no cyanosis, no clubbing Skin:  No rashes no nodules Neuro:  CN II through XII intact, motor grossly intact  DEVICE  Normal device function.  See PaceArt for details. Approaching ERI.  Assess/Plan:

## 2011-06-21 NOTE — Assessment & Plan Note (Signed)
He has had one episode of ventricular tachycardia in the last year. We'll plan to continue his current medical therapy.

## 2011-06-21 NOTE — Assessment & Plan Note (Signed)
His device is working normally. We'll plan to recheck in several months. He is approaching elective replacement. 

## 2011-07-06 ENCOUNTER — Encounter: Payer: Self-pay | Admitting: Cardiology

## 2011-07-06 ENCOUNTER — Ambulatory Visit (INDEPENDENT_AMBULATORY_CARE_PROVIDER_SITE_OTHER): Payer: Medicare Other | Admitting: Pharmacist

## 2011-07-06 ENCOUNTER — Ambulatory Visit (INDEPENDENT_AMBULATORY_CARE_PROVIDER_SITE_OTHER): Payer: Medicare Other | Admitting: Cardiology

## 2011-07-06 DIAGNOSIS — I4891 Unspecified atrial fibrillation: Secondary | ICD-10-CM

## 2011-07-06 DIAGNOSIS — I472 Ventricular tachycardia, unspecified: Secondary | ICD-10-CM

## 2011-07-06 DIAGNOSIS — Z7901 Long term (current) use of anticoagulants: Secondary | ICD-10-CM

## 2011-07-06 DIAGNOSIS — I509 Heart failure, unspecified: Secondary | ICD-10-CM

## 2011-07-06 DIAGNOSIS — E785 Hyperlipidemia, unspecified: Secondary | ICD-10-CM

## 2011-07-06 DIAGNOSIS — I2510 Atherosclerotic heart disease of native coronary artery without angina pectoris: Secondary | ICD-10-CM

## 2011-07-06 DIAGNOSIS — I5022 Chronic systolic (congestive) heart failure: Secondary | ICD-10-CM

## 2011-07-06 MED ORDER — SPIRONOLACTONE 25 MG PO TABS: 25.0000 mg | ORAL_TABLET | Freq: Every day | ORAL | Status: DC

## 2011-07-06 NOTE — Assessment & Plan Note (Signed)
NYHA class II symptoms without significant volume overload.  Continue lisinopril and sotalol.  Increase spironolactone to 25 mg daily.  BMET in 2 weeks.

## 2011-07-06 NOTE — Assessment & Plan Note (Signed)
Stable with no chest pain.  Continue ASA 81, lisinopril, and statin.  She is on sotalol as a beta blocker.  

## 2011-07-06 NOTE — Assessment & Plan Note (Signed)
Paroxysmal.  He is in NSR today.  QT interval is normal on sotalol.  Will continue sotalol to maintain NSR and warfarin.  Followup BMET for creatinine.

## 2011-07-06 NOTE — Assessment & Plan Note (Signed)
Has ICD and is on sotalol.  Will check BMET and Mg on sotalol.  QT interval normal on today's ECG.

## 2011-07-06 NOTE — Patient Instructions (Signed)
Increase spironolactone to 25mg  daily.  Your physician recommends that you return for lab work in: 2 weeks--BMET/Magnesium 428.22  427.31  Your physician recommends that you schedule a follow-up appointment in: 4 months with Dr Shirlee Latch.(June 2013).

## 2011-07-06 NOTE — Progress Notes (Signed)
PCP: Dr. Manus Gunning  78 yo with history of CAD, ischemic cardiomyopathy, and paroxysmal atrial fibrillation presents for cardiology followup.  He is in NSR today.  No recent tachypalpitations.  He had 1 episode of VT recorded on his ICD back in 12/12.  He says that his mood has improved somewhat recently (lost his wife less than a year ago).  Not particularly active but no exertional dyspnea or chest pain.  Continues to take sotalol.    ECG: NSR, narrow QRS and normal QT interval, 1st degree AV block (222 msec), old ASMI with residual anterior ST elevation suggestive of possible LV aneurysm.   Labs (4/12): LDL 56, HDL 31 Labs (10/12): K 4.4, creatinine 1.2, TSH normal, HCT 48 Labs (11/12): K 4.3, creatinine 1.2 (creatinine clearance > 60), LFTs normal, LDL 53, HDL 31  PMH: 1.  CAD: Anterior MI 1995.  Myoview (6/09) with fixed anterior and apical defect, EF 36%.  2.  LV mural thrombus noted in 2006.  Patient had CVA related to this.  He is on chronic coumadin.  3.  Ischemic cardiomyopathy: Echo (10/12) with EF 30-35%, mild LV hypertrophy, apical septal and anterior akinesis, no LV thrombus seen, mild MR.  Patient has a Secondary school teacher ICD.  4.  H/o VT 5.  Prostate cancer s/p treatment 6.  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  S/p multiple surgeries for excision and right nephrectomy.  7.  PFO 8.  Atrial fibrillation: Paroxysmal.  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   9.  HTN 10. Hyperlipidemia 11. 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  Widower.  Has 3 sons.  Quit smoking in 1991.   FH: No premature CAD  ROS: All systems reviewed and negative except as per HPI.   Current Outpatient Prescriptions  Medication Sig Dispense Refill  . amLODipine (NORVASC) 5 MG tablet TAKE ONE TABLET BY MOUTH TWICE DAILY  180 tablet  3  . aspirin 81 MG tablet Take 81 mg by mouth daily.        . DiphenhydrAMINE HCl (DIPHEN PO)  Take by mouth as needed.        . furosemide (LASIX) 40 MG tablet TAKE ONE TABLET BY MOUTH EVERY DAY  90 tablet  3  . lisinopril (PRINIVIL,ZESTRIL) 20 MG tablet TAKE ONE TABLET BY MOUTH EVERY DAY  90 tablet  3  . simvastatin (ZOCOR) 40 MG tablet Take 40 mg by mouth daily.        . sotalol (BETAPACE) 160 MG tablet TAKE ONE TABLET BY MOUTH TWICE DAILY  180 tablet  3  . warfarin (COUMADIN) 5 MG tablet TAKE AS DIRECTED  90 tablet  0  . DISCONTD: spironolactone (ALDACTONE) 25 MG tablet Take 1/2 tablet daily  45 tablet  3  . spironolactone (ALDACTONE) 25 MG tablet Take 1 tablet (25 mg total) by mouth daily.  90 tablet  3    BP 138/70  Pulse 66  Wt 235 lb 12.8 oz (106.958 kg) General: NAD Neck: No JVD, no thyromegaly or thyroid nodule.  Lungs: Clear to auscultation bilaterally with normal respiratory effort. CV: Nondisplaced PMI.  Heart regular S1/S2, no S3/S4, 1/6 SEM.  1+ ankle edema.  No carotid bruit.  Normal pedal pulses.  Abdomen: Soft, nontender, no hepatosplenomegaly, no distention.  Neurologic: Alert and oriented x 3.  Psych: Normal affect. Extremities: No clubbing or cyanosis.

## 2011-07-06 NOTE — Assessment & Plan Note (Signed)
LDL was at goal in 11/12, < 70.

## 2011-07-20 ENCOUNTER — Other Ambulatory Visit (INDEPENDENT_AMBULATORY_CARE_PROVIDER_SITE_OTHER): Payer: Medicare Other

## 2011-07-20 DIAGNOSIS — I4891 Unspecified atrial fibrillation: Secondary | ICD-10-CM

## 2011-07-20 DIAGNOSIS — I509 Heart failure, unspecified: Secondary | ICD-10-CM

## 2011-07-20 DIAGNOSIS — I5022 Chronic systolic (congestive) heart failure: Secondary | ICD-10-CM

## 2011-07-20 LAB — BASIC METABOLIC PANEL
BUN: 26 mg/dL — ABNORMAL HIGH (ref 6–23)
CO2: 28 mEq/L (ref 19–32)
Calcium: 9.7 mg/dL (ref 8.4–10.5)
Creatinine, Ser: 1.4 mg/dL (ref 0.4–1.5)
Glucose, Bld: 165 mg/dL — ABNORMAL HIGH (ref 70–99)

## 2011-07-20 LAB — MAGNESIUM: Magnesium: 2.1 mg/dL (ref 1.5–2.5)

## 2011-08-08 ENCOUNTER — Other Ambulatory Visit: Payer: Self-pay | Admitting: Cardiology

## 2011-08-16 ENCOUNTER — Ambulatory Visit (INDEPENDENT_AMBULATORY_CARE_PROVIDER_SITE_OTHER): Payer: Medicare Other | Admitting: *Deleted

## 2011-08-16 DIAGNOSIS — I4891 Unspecified atrial fibrillation: Secondary | ICD-10-CM

## 2011-08-16 DIAGNOSIS — Z7901 Long term (current) use of anticoagulants: Secondary | ICD-10-CM

## 2011-09-06 ENCOUNTER — Ambulatory Visit (INDEPENDENT_AMBULATORY_CARE_PROVIDER_SITE_OTHER): Payer: Medicare Other | Admitting: *Deleted

## 2011-09-06 DIAGNOSIS — I4891 Unspecified atrial fibrillation: Secondary | ICD-10-CM

## 2011-09-06 DIAGNOSIS — Z7901 Long term (current) use of anticoagulants: Secondary | ICD-10-CM

## 2011-09-06 LAB — POCT INR: INR: 2.7

## 2011-10-04 ENCOUNTER — Ambulatory Visit (INDEPENDENT_AMBULATORY_CARE_PROVIDER_SITE_OTHER): Payer: Medicare Other | Admitting: *Deleted

## 2011-10-04 DIAGNOSIS — Z7901 Long term (current) use of anticoagulants: Secondary | ICD-10-CM

## 2011-10-04 DIAGNOSIS — I4891 Unspecified atrial fibrillation: Secondary | ICD-10-CM

## 2011-10-20 ENCOUNTER — Encounter: Payer: Self-pay | Admitting: Internal Medicine

## 2011-10-20 ENCOUNTER — Ambulatory Visit (INDEPENDENT_AMBULATORY_CARE_PROVIDER_SITE_OTHER): Payer: Medicare Other | Admitting: *Deleted

## 2011-10-20 DIAGNOSIS — I4891 Unspecified atrial fibrillation: Secondary | ICD-10-CM

## 2011-10-20 DIAGNOSIS — Z95810 Presence of automatic (implantable) cardiac defibrillator: Secondary | ICD-10-CM

## 2011-10-20 DIAGNOSIS — I472 Ventricular tachycardia: Secondary | ICD-10-CM

## 2011-10-20 LAB — ICD DEVICE OBSERVATION
DEVICE MODEL ICD: 347221
TOT-0006: 20130212000000
TOT-0008: 0
TOT-0009: 6
TZAT-0001FASTVT: 1
TZAT-0001SLOWVT: 1
TZAT-0004FASTVT: 8
TZAT-0012FASTVT: 200 ms
TZAT-0019FASTVT: 7.5 V
TZAT-0020SLOWVT: 1 ms
TZON-0003FASTVT: 315 ms
TZON-0004FASTVT: 12
TZON-0010SLOWVT: 80 ms
TZST-0001FASTVT: 3
TZST-0001FASTVT: 5
TZST-0003FASTVT: 22.5 J
TZST-0003FASTVT: 36 J
VENTRICULAR PACING ICD: 1 pct

## 2011-10-20 NOTE — Progress Notes (Signed)
icd check in clinic  

## 2011-10-31 ENCOUNTER — Ambulatory Visit (INDEPENDENT_AMBULATORY_CARE_PROVIDER_SITE_OTHER): Payer: Medicare Other | Admitting: Pharmacist

## 2011-10-31 ENCOUNTER — Encounter: Payer: Self-pay | Admitting: Cardiology

## 2011-10-31 ENCOUNTER — Ambulatory Visit (INDEPENDENT_AMBULATORY_CARE_PROVIDER_SITE_OTHER): Payer: Medicare Other | Admitting: Cardiology

## 2011-10-31 VITALS — BP 138/68 | HR 62 | Ht 74.0 in | Wt 235.5 lb

## 2011-10-31 DIAGNOSIS — I4891 Unspecified atrial fibrillation: Secondary | ICD-10-CM

## 2011-10-31 DIAGNOSIS — E785 Hyperlipidemia, unspecified: Secondary | ICD-10-CM

## 2011-10-31 DIAGNOSIS — I5020 Unspecified systolic (congestive) heart failure: Secondary | ICD-10-CM

## 2011-10-31 DIAGNOSIS — I2510 Atherosclerotic heart disease of native coronary artery without angina pectoris: Secondary | ICD-10-CM

## 2011-10-31 DIAGNOSIS — Z7901 Long term (current) use of anticoagulants: Secondary | ICD-10-CM

## 2011-10-31 DIAGNOSIS — I509 Heart failure, unspecified: Secondary | ICD-10-CM

## 2011-10-31 DIAGNOSIS — I5022 Chronic systolic (congestive) heart failure: Secondary | ICD-10-CM

## 2011-10-31 DIAGNOSIS — R0602 Shortness of breath: Secondary | ICD-10-CM

## 2011-10-31 LAB — POCT INR: INR: 2.6

## 2011-10-31 LAB — BASIC METABOLIC PANEL
Chloride: 101 mEq/L (ref 96–112)
Creatinine, Ser: 1.5 mg/dL (ref 0.4–1.5)
GFR: 46.41 mL/min — ABNORMAL LOW (ref >60.00)
Potassium: 4.8 mEq/L (ref 3.5–5.1)

## 2011-10-31 LAB — HEPATIC FUNCTION PANEL
ALT: 18 U/L (ref 0–53)
Albumin: 4 g/dL (ref 3.5–5.2)
Total Protein: 7.5 g/dL (ref 6.0–8.3)

## 2011-10-31 LAB — LIPID PANEL
Cholesterol: 125 mg/dL (ref 0–200)
HDL: 35.1 mg/dL — ABNORMAL LOW (ref >39.00)
VLDL: 51 mg/dL — ABNORMAL HIGH (ref 0.0–40.0)

## 2011-10-31 LAB — BRAIN NATRIURETIC PEPTIDE: Pro B Natriuretic peptide (BNP): 66 pg/mL (ref 0.0–100.0)

## 2011-10-31 LAB — MAGNESIUM: Magnesium: 2 mg/dL (ref 1.5–2.5)

## 2011-10-31 NOTE — Assessment & Plan Note (Addendum)
NYHA class II symptoms without significant volume overload.  Continue lisinopril, spironolactone, and sotalol.  Has ICD.  No change in Lasix dosing.  Will get BMET, BNP today.

## 2011-10-31 NOTE — Assessment & Plan Note (Signed)
Paroxysmal.  He is in NSR today.  QT interval is normal on sotalol.  Will continue sotalol to maintain NSR and warfarin.  Followup BMET and Mg today.

## 2011-10-31 NOTE — Patient Instructions (Addendum)
Your physician recommends that you have a FASTING lipid profile /liver profile/BMET/BNP/Magnesium today   Your physician wants you to follow-up in: 4 months with Dr Shirlee Latch. (October 2013) You will receive a reminder letter in the mail two months in advance. If you don't receive a letter, please call our office to schedule the follow-up appointment.

## 2011-10-31 NOTE — Assessment & Plan Note (Signed)
Stable with no chest pain.  Continue ASA 81, lisinopril, and statin.  He is on sotalol as a beta blocker.

## 2011-10-31 NOTE — Progress Notes (Signed)
Patient ID: [name] [name], male   DOB: 1929/07/03, 78 y.o.   MRN: [id number] PCP: Dr. Manus Gunning  78 yo with history of CAD, ischemic cardiomyopathy, and paroxysmal atrial fibrillation presents for cardiology followup.  He is in NSR today on sotalol.  No recent tachypalpitations.  Stable lower extremity edema.  Stable exertional dyspnea: short of breath with a long flight of steps or when walking up a hill.  No problem on flat ground.  Not very active.  No chest pain.  Weight is unchanged today.   ECG: NSR, narrow QRS and normal QT interval, 1st degree AV block, old ASMI with residual anterior ST elevation suggestive of possible LV aneurysm.   Labs (4/12): LDL 56, HDL 31 Labs (10/12): K 4.4, creatinine 1.2, TSH normal, HCT 48 Labs (11/12): K 4.3, creatinine 1.2 (creatinine clearance > 60), LFTs normal, LDL 53, HDL 31 Labs (3/13): K 4.8, creatinine 1.4, Mg 2.1  PMH: 1.  CAD: Anterior MI 1995.  Myoview (6/09) with fixed anterior and apical defect, EF 36%.  2.  LV mural thrombus noted in 2006.  Patient had CVA related to this.  He is on chronic coumadin.  3.  Ischemic cardiomyopathy: Echo (10/12) with EF 30-35%, mild LV hypertrophy, apical septal and anterior akinesis, no LV thrombus seen, mild MR.  Patient has a Secondary school teacher ICD.  4.  H/o VT 5.  Prostate cancer s/p treatment 6.  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  S/p multiple surgeries for excision and right nephrectomy.  7.  PFO 8.  Atrial fibrillation: Paroxysmal.  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   9.  HTN 10. Hyperlipidemia 11. 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  Has 3 sons.  Quit smoking in 1991.   FH: No premature CAD   Current Outpatient Prescriptions  Medication Sig Dispense Refill  . amLODipine (NORVASC) 5 MG tablet TAKE ONE TABLET BY MOUTH TWICE DAILY  180 tablet  3  . aspirin 81 MG tablet Take 81 mg by mouth daily.        .  DiphenhydrAMINE HCl (DIPHEN PO) Take by mouth as needed.        . furosemide (LASIX) 40 MG tablet TAKE ONE TABLET BY MOUTH EVERY DAY  90 tablet  3  . lisinopril (PRINIVIL,ZESTRIL) 20 MG tablet TAKE ONE TABLET BY MOUTH EVERY DAY  90 tablet  3  . simvastatin (ZOCOR) 40 MG tablet Take 40 mg by mouth daily.        . sotalol (BETAPACE) 160 MG tablet TAKE ONE TABLET BY MOUTH TWICE DAILY  180 tablet  3  . spironolactone (ALDACTONE) 25 MG tablet Take 1 tablet (25 mg total) by mouth daily.  90 tablet  3  . warfarin (COUMADIN) 5 MG tablet TAKE AS DIRECTED  90 tablet  1    BP 138/68  Pulse 62  Ht 6\' 2"  (1.88 m)  Wt 106.822 kg (235 lb 8 oz)  BMI 30.24 kg/m2 General: NAD Neck: No JVD, no thyromegaly or thyroid nodule.  Lungs: Clear to auscultation bilaterally with normal respiratory effort. CV: Nondisplaced PMI.  Heart regular S1/S2, no S3/S4, 1/6 SEM.  1+ ankle edema.  No carotid bruit.  Normal pedal pulses.  Abdomen: Soft, nontender, no hepatosplenomegaly, no distention.  Neurologic: Alert and oriented x 3.  Psych: Normal affect. Extremities: No clubbing or cyanosis.

## 2011-10-31 NOTE — Assessment & Plan Note (Signed)
Check lipids today, goal LDL < 70.  

## 2011-11-08 ENCOUNTER — Other Ambulatory Visit: Payer: Self-pay | Admitting: *Deleted

## 2011-11-08 DIAGNOSIS — I5022 Chronic systolic (congestive) heart failure: Secondary | ICD-10-CM

## 2011-11-18 ENCOUNTER — Other Ambulatory Visit: Payer: Self-pay | Admitting: *Deleted

## 2011-11-18 ENCOUNTER — Other Ambulatory Visit (INDEPENDENT_AMBULATORY_CARE_PROVIDER_SITE_OTHER): Payer: Medicare Other

## 2011-11-18 DIAGNOSIS — R0602 Shortness of breath: Secondary | ICD-10-CM

## 2011-11-18 DIAGNOSIS — I5020 Unspecified systolic (congestive) heart failure: Secondary | ICD-10-CM

## 2011-11-18 DIAGNOSIS — I5022 Chronic systolic (congestive) heart failure: Secondary | ICD-10-CM

## 2011-11-18 LAB — BASIC METABOLIC PANEL
BUN: 28 mg/dL — ABNORMAL HIGH (ref 6–23)
CO2: 26 mEq/L (ref 19–32)
Chloride: 104 mEq/L (ref 96–112)
Creatinine, Ser: 1.6 mg/dL — ABNORMAL HIGH (ref 0.4–1.5)
Potassium: 4.8 mEq/L (ref 3.5–5.1)

## 2011-11-21 ENCOUNTER — Telehealth: Payer: Self-pay | Admitting: *Deleted

## 2011-11-21 NOTE — Telephone Encounter (Signed)
Dr Shirlee Latch has recommended pt decrease lisinopril to 10mg  daily. Pt scheduled for repeat BMET 11/28/11. I spoke with pt today and pt states that his weight has been unchanged since decreasing lasix to 20mg  every other day 11/18/11.

## 2011-11-28 ENCOUNTER — Other Ambulatory Visit (INDEPENDENT_AMBULATORY_CARE_PROVIDER_SITE_OTHER): Payer: Medicare Other

## 2011-11-28 DIAGNOSIS — R0989 Other specified symptoms and signs involving the circulatory and respiratory systems: Secondary | ICD-10-CM

## 2011-11-30 ENCOUNTER — Other Ambulatory Visit (INDEPENDENT_AMBULATORY_CARE_PROVIDER_SITE_OTHER): Payer: Medicare Other

## 2011-11-30 DIAGNOSIS — I5020 Unspecified systolic (congestive) heart failure: Secondary | ICD-10-CM

## 2011-11-30 DIAGNOSIS — R0602 Shortness of breath: Secondary | ICD-10-CM

## 2011-11-30 LAB — BASIC METABOLIC PANEL
CO2: 21 mEq/L (ref 19–32)
Calcium: 9.2 mg/dL (ref 8.4–10.5)
Chloride: 103 mEq/L (ref 96–112)
Glucose, Bld: 192 mg/dL — ABNORMAL HIGH (ref 70–99)
Sodium: 135 mEq/L (ref 135–145)

## 2011-12-12 ENCOUNTER — Ambulatory Visit (INDEPENDENT_AMBULATORY_CARE_PROVIDER_SITE_OTHER): Payer: Medicare Other

## 2011-12-12 DIAGNOSIS — I4891 Unspecified atrial fibrillation: Secondary | ICD-10-CM

## 2011-12-12 DIAGNOSIS — Z7901 Long term (current) use of anticoagulants: Secondary | ICD-10-CM

## 2012-01-20 ENCOUNTER — Encounter: Payer: Self-pay | Admitting: *Deleted

## 2012-01-20 DIAGNOSIS — Z95810 Presence of automatic (implantable) cardiac defibrillator: Secondary | ICD-10-CM | POA: Insufficient documentation

## 2012-01-31 ENCOUNTER — Encounter: Payer: Self-pay | Admitting: Internal Medicine

## 2012-01-31 ENCOUNTER — Ambulatory Visit (INDEPENDENT_AMBULATORY_CARE_PROVIDER_SITE_OTHER): Payer: Medicare Other | Admitting: *Deleted

## 2012-01-31 ENCOUNTER — Ambulatory Visit (INDEPENDENT_AMBULATORY_CARE_PROVIDER_SITE_OTHER): Payer: Medicare Other | Admitting: Internal Medicine

## 2012-01-31 VITALS — BP 148/78 | HR 60 | Ht 73.0 in | Wt 232.0 lb

## 2012-01-31 DIAGNOSIS — I4891 Unspecified atrial fibrillation: Secondary | ICD-10-CM

## 2012-01-31 DIAGNOSIS — I2510 Atherosclerotic heart disease of native coronary artery without angina pectoris: Secondary | ICD-10-CM

## 2012-01-31 DIAGNOSIS — Z7901 Long term (current) use of anticoagulants: Secondary | ICD-10-CM

## 2012-01-31 DIAGNOSIS — Z95810 Presence of automatic (implantable) cardiac defibrillator: Secondary | ICD-10-CM

## 2012-01-31 DIAGNOSIS — I472 Ventricular tachycardia: Secondary | ICD-10-CM

## 2012-01-31 LAB — ICD DEVICE OBSERVATION
RV LEAD IMPEDENCE ICD: 580 Ohm
TOT-0006: 20130613000000
TOT-0008: 0
TOT-0009: 6
TOT-0010: 52
TZAT-0001FASTVT: 1
TZAT-0004SLOWVT: 8
TZAT-0012FASTVT: 200 ms
TZAT-0013FASTVT: 1
TZAT-0013SLOWVT: 8
TZAT-0018FASTVT: NEGATIVE
TZAT-0018SLOWVT: NEGATIVE
TZAT-0019FASTVT: 7.5 V
TZAT-0020FASTVT: 1 ms
TZON-0003FASTVT: 315 ms
TZON-0003SLOWVT: 460 ms
TZON-0004FASTVT: 12
TZON-0005FASTVT: 6
TZST-0001FASTVT: 4
TZST-0003FASTVT: 36 J
TZST-0003FASTVT: 36 J

## 2012-01-31 NOTE — Assessment & Plan Note (Signed)
His device is working normally. We'll plan to recheck in several months. 

## 2012-01-31 NOTE — Progress Notes (Signed)
HPI Mr. Humphrey returns today for followup. He is a very pleasant 78 year old man with ventricular tachycardia, hypertension, bladder cancer, prostate cancer, status post ICD implantation. The patient denies chest pain or shortness of breath. He notes that his prostate levels have increased recently. No peripheral edema. No syncope. He denies recent ICD shock. He is tolerating fairly high dose sotalol nicely. Allergies  Allergen Reactions  . Penicillins      Current Outpatient Prescriptions  Medication Sig Dispense Refill  . amLODipine (NORVASC) 5 MG tablet TAKE ONE TABLET BY MOUTH TWICE DAILY  180 tablet  3  . aspirin 81 MG tablet Take 81 mg by mouth daily.        . citalopram (CELEXA) 10 MG tablet Take 10 mg by mouth daily.      . furosemide (LASIX) 20 MG tablet (1/2) 40mg  tablet every other day      . lisinopril (PRINIVIL,ZESTRIL) 10 MG tablet 1/2 of a 20mg  tablet daily      . simvastatin (ZOCOR) 40 MG tablet Take 40 mg by mouth daily.        . sotalol (BETAPACE) 160 MG tablet TAKE ONE TABLET BY MOUTH TWICE DAILY  180 tablet  3  . spironolactone (ALDACTONE) 25 MG tablet Take 1 tablet (25 mg total) by mouth daily.  90 tablet  3  . warfarin (COUMADIN) 5 MG tablet TAKE AS DIRECTED  90 tablet  1     Past Medical History  Diagnosis Date  . PVCs (premature ventricular contractions)   . Heart palpitations   . Coronary artery disease   . Hypertension   . Atrial fibrillation   . Anxiety     CHRONIC  . Myocardial infarction 1995    remote anterior   . Ventricular tachycardia   . Bladder cancer   . Hyperthyroidism   . Patent foramen ovale   . Mural thrombus of heart   . Genitourinary disease     CANCER  . Hyperlipidemia   . Stroke   . Left ventricular dysfunction     ROS:   All systems reviewed and negative except as noted in the HPI.   Past Surgical History  Procedure Date  . Icd 2007  . Kidney surgery     REMOVED  . Bladder     PART OF IT REMOVED  . Cardiac  catheterization   . Prostatectomy     TOTAL  . Eye surgery 2000    CATARACT BOTH EYES  . Hernia repair 1956     Family History  Problem Relation Age of Onset  . Heart attack Father      History   Social History  . Marital Status: Married    Spouse Name: N/A    Number of Children: N/A  . Years of Education: N/A   Occupational History  . Not on file.   Social History Main Topics  . Smoking status: Former Smoker    Quit date: 05/09/1990  . Smokeless tobacco: Not on file  . Alcohol Use: No  . Drug Use: No  . Sexually Active: No   Other Topics Concern  . Not on file   Social History Narrative  . No narrative on file     BP 148/78  Pulse 60  Ht 6\' 1"  (1.854 m)  Wt 232 lb (105.235 kg)  BMI 30.61 kg/m2  Physical Exam:  Well appearing 78 year old man, NAD HEENT: Unremarkable Neck:  7 cm JVD, no thyromegally Lungs:  Clear with no wheezes, rales,  or rhonchi. HEART:  Regular rate rhythm, no murmurs, no rubs, no clicks Abd:  soft, positive bowel sounds, no organomegally, no rebound, no guarding Ext:  2 plus pulses, no edema, no cyanosis, no clubbing Skin:  No rashes no nodules Neuro:  CN II through XII intact, motor grossly intact  DEVICE  Normal device function.  See PaceArt for details.   Assess/Plan:

## 2012-01-31 NOTE — Assessment & Plan Note (Signed)
He denies anginal symptoms. He'll continue his current medical therapy. 

## 2012-01-31 NOTE — Patient Instructions (Addendum)
Your physician recommends that you schedule a follow-up appointment in: 3 months with [name] [name] and [name] [name]. Your physician wants you to follow-up in: 12 months with Dr Taylor.  You will receive a reminder letter in the mail two months in advance. If you don't receive a letter, please call our office to schedule the follow-up appointment.  

## 2012-01-31 NOTE — Assessment & Plan Note (Signed)
He has had no recurrent ventricular arrhythmias in the last year. No change in medical therapy for now. I would consider reducing his dose of sotalol in the future.

## 2012-02-13 ENCOUNTER — Encounter: Payer: Self-pay | Admitting: Cardiology

## 2012-02-13 ENCOUNTER — Other Ambulatory Visit: Payer: Self-pay | Admitting: *Deleted

## 2012-02-13 ENCOUNTER — Ambulatory Visit (INDEPENDENT_AMBULATORY_CARE_PROVIDER_SITE_OTHER): Payer: Medicare Other | Admitting: Cardiology

## 2012-02-13 VITALS — BP 138/80 | HR 62 | Ht 73.0 in | Wt 234.4 lb

## 2012-02-13 DIAGNOSIS — I509 Heart failure, unspecified: Secondary | ICD-10-CM

## 2012-02-13 DIAGNOSIS — E785 Hyperlipidemia, unspecified: Secondary | ICD-10-CM

## 2012-02-13 DIAGNOSIS — I4891 Unspecified atrial fibrillation: Secondary | ICD-10-CM

## 2012-02-13 DIAGNOSIS — Z95810 Presence of automatic (implantable) cardiac defibrillator: Secondary | ICD-10-CM

## 2012-02-13 DIAGNOSIS — I5022 Chronic systolic (congestive) heart failure: Secondary | ICD-10-CM

## 2012-02-13 DIAGNOSIS — I2510 Atherosclerotic heart disease of native coronary artery without angina pectoris: Secondary | ICD-10-CM

## 2012-02-13 DIAGNOSIS — R0602 Shortness of breath: Secondary | ICD-10-CM

## 2012-02-13 LAB — BASIC METABOLIC PANEL
BUN: 25 mg/dL — ABNORMAL HIGH (ref 6–23)
CO2: 28 mEq/L (ref 19–32)
Calcium: 9.4 mg/dL (ref 8.4–10.5)
GFR: 50.93 mL/min — ABNORMAL LOW (ref >60.00)
Glucose, Bld: 144 mg/dL — ABNORMAL HIGH (ref 70–99)
Potassium: 5.1 mEq/L (ref 3.5–5.1)

## 2012-02-13 MED ORDER — CARVEDILOL 3.125 MG PO TABS: 3.1250 mg | ORAL_TABLET | Freq: Two times a day (BID) | ORAL | Status: DC

## 2012-02-13 MED ORDER — SOTALOL HCL 120 MG PO TABS: 120.0000 mg | ORAL_TABLET | Freq: Two times a day (BID) | ORAL | Status: DC

## 2012-02-13 MED ORDER — AMLODIPINE BESYLATE 5 MG PO TABS: 5.0000 mg | ORAL_TABLET | Freq: Two times a day (BID) | ORAL | Status: DC

## 2012-02-13 NOTE — Progress Notes (Signed)
Patient ID: [name] [name], male   DOB: 1929/07/09, 78 y.o.   MRN: [id number] PCP: Dr. Manus Gunning  78 yo with history of CAD, ischemic cardiomyopathy, and paroxysmal atrial fibrillation presents for cardiology followup.  He is in NSR today on sotalol.  No recent tachypalpitations.  Stable exertional dyspnea: short of breath with a long flight of steps or when walking up a hill.  No problem on flat ground. Not very active still.  Has had some depression since his wife's death and is on Celexa.  No chest pain. Weight is down 1 lb since last appointment.  His PSA has been rising and he is following up with his urologist about this.   ECG: NSR, 1st degree AV block, LAFB, old ASMI, QTc interval normal   Labs (4/12): LDL 56, HDL 31 Labs (10/12): K 4.4, creatinine 1.2, TSH normal, HCT 48 Labs (11/12): K 4.3, creatinine 1.2 (creatinine clearance > 60), LFTs normal, LDL 53, HDL 31 Labs (3/13): K 4.8, creatinine 1.4, Mg 2.1 Labs (6/13): HDL 35, LDL 63 Labs (7/13): BNP 112, K 4.8, creatinine 1.4  PMH: 1.  CAD: Anterior MI 1995.  Myoview (6/09) with fixed anterior and apical defect, EF 36%.  2.  LV mural thrombus noted in 2006.  Patient had CVA related to this.  He is on chronic coumadin.  3.  Ischemic cardiomyopathy: Echo (10/12) with EF 30-35%, mild LV hypertrophy, apical septal and anterior akinesis, no LV thrombus seen, mild MR.  Patient has a Secondary school teacher ICD.  4.  H/o VT 5.  Prostate cancer s/p radiation 6.  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  S/p multiple surgeries for excision and right nephrectomy.  7.  PFO 8.  Atrial fibrillation: Paroxysmal.  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   9.  HTN 10. Hyperlipidemia 11. 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  Has 3 sons.  Quit smoking in 1991.   FH: No premature CAD  ROS: All systems reviewed and negative except as per HPI.    Current Outpatient  Prescriptions  Medication Sig Dispense Refill  . amLODipine (NORVASC) 5 MG tablet Take 1 tablet (5 mg total) by mouth 2 (two) times daily.  180 tablet  3  . aspirin 81 MG tablet Take 81 mg by mouth daily.        . citalopram (CELEXA) 10 MG tablet Take 10 mg by mouth daily.      . furosemide (LASIX) 20 MG tablet (1/2) 40mg  tablet every other day      . lisinopril (PRINIVIL,ZESTRIL) 10 MG tablet 1/2 of a 20mg  tablet daily      . simvastatin (ZOCOR) 40 MG tablet Take 40 mg by mouth daily.        Marland Kitchen spironolactone (ALDACTONE) 25 MG tablet Take 1 tablet (25 mg total) by mouth daily.  90 tablet  3  . warfarin (COUMADIN) 5 MG tablet TAKE AS DIRECTED  90 tablet  1  . DISCONTD: amLODipine (NORVASC) 5 MG tablet TAKE ONE TABLET BY MOUTH TWICE DAILY  180 tablet  3  . DISCONTD: sotalol (BETAPACE) 160 MG tablet TAKE ONE TABLET BY MOUTH TWICE DAILY  180 tablet  3  . carvedilol (COREG) 3.125 MG tablet Take 1 tablet (3.125 mg total) by mouth 2 (two) times daily.  180 tablet  3  . sotalol (BETAPACE) 120 MG tablet Take 1 tablet (120 mg total) by mouth 2 (  two) times daily.  180 tablet  3    BP 138/80  Pulse 62  Ht 6\' 1"  (1.854 m)  Wt 234 lb 6.4 oz (106.323 kg)  BMI 30.93 kg/m2 General: NAD Neck: No JVD, no thyromegaly or thyroid nodule.  Lungs: Clear to auscultation bilaterally with normal respiratory effort. CV: Nondisplaced PMI.  Heart regular S1/S2, no S3/S4, 1/6 SEM.  Trace ankle edema bilaterally.  No carotid bruit.  Normal pedal pulses.  Abdomen: Soft, nontender, no hepatosplenomegaly, no distention.  Neurologic: Alert and oriented x 3.  Psych: Normal affect. Extremities: No clubbing or cyanosis.   Assessment/Plan:  Atrial fibrillation Paroxysmal. He is in NSR today with no recent symptoms concerning for atrial fibrillation. QT interval is normal on sotalol. He has been on a rather high dose of sotalol and has a reduced GFR.  I am going to decrease the sotalol dose to 120 mg bid.  Continue  warfarin. Would use this rather than NOAC agent given h/o LV thrombus.   CAD Stable with no chest pain. Continue ASA 81, lisinopril, and statin. Hyperlipidemia  LDL at goal, < 70, in 6/13.  Systolic CHF, chronic  NYHA class II symptoms without significant volume overload. Continue lisinopril and spironolactone.  Since I am cutting back the sotalol dose, I will add Coreg 3.125 mg bid to his regimn. Has ICD. I am not going to make any changes to his Lasix dosing.  BMET/BNP today.   Marca Ancona 02/13/2012 3:52 PM

## 2012-02-13 NOTE — Patient Instructions (Addendum)
Decrease sotalol to 120mg  two times a day.   Start coreg(carvedilol) 3.125mg  two times a day.  Your physician recommends that you have lab work today--BMET/BNP.  Your physician recommends that you schedule a follow-up appointment in: 3 months with Dr Shirlee Latch.

## 2012-02-28 ENCOUNTER — Other Ambulatory Visit (INDEPENDENT_AMBULATORY_CARE_PROVIDER_SITE_OTHER): Payer: Medicare Other

## 2012-02-28 DIAGNOSIS — I5022 Chronic systolic (congestive) heart failure: Secondary | ICD-10-CM

## 2012-02-28 LAB — BASIC METABOLIC PANEL
BUN: 17 mg/dL (ref 6–23)
Chloride: 103 mEq/L (ref 96–112)
Creatinine, Ser: 1.3 mg/dL (ref 0.4–1.5)
Glucose, Bld: 149 mg/dL — ABNORMAL HIGH (ref 70–99)
Potassium: 5 mEq/L (ref 3.5–5.1)

## 2012-03-13 ENCOUNTER — Ambulatory Visit (INDEPENDENT_AMBULATORY_CARE_PROVIDER_SITE_OTHER): Payer: Medicare Other | Admitting: *Deleted

## 2012-03-13 DIAGNOSIS — I4891 Unspecified atrial fibrillation: Secondary | ICD-10-CM

## 2012-03-13 DIAGNOSIS — Z7901 Long term (current) use of anticoagulants: Secondary | ICD-10-CM

## 2012-03-13 LAB — POCT INR: INR: 1.9

## 2012-03-26 ENCOUNTER — Other Ambulatory Visit: Payer: Self-pay | Admitting: *Deleted

## 2012-03-26 MED ORDER — SIMVASTATIN 40 MG PO TABS: 40.0000 mg | ORAL_TABLET | Freq: Every day | ORAL | Status: DC

## 2012-04-02 ENCOUNTER — Other Ambulatory Visit: Payer: Self-pay

## 2012-04-02 MED ORDER — SIMVASTATIN 40 MG PO TABS: 40.0000 mg | ORAL_TABLET | Freq: Every day | ORAL | Status: DC

## 2012-04-10 ENCOUNTER — Ambulatory Visit (INDEPENDENT_AMBULATORY_CARE_PROVIDER_SITE_OTHER): Payer: Medicare Other | Admitting: *Deleted

## 2012-04-10 DIAGNOSIS — I4891 Unspecified atrial fibrillation: Secondary | ICD-10-CM

## 2012-04-10 DIAGNOSIS — Z7901 Long term (current) use of anticoagulants: Secondary | ICD-10-CM

## 2012-04-10 LAB — POCT INR: INR: 3

## 2012-04-24 ENCOUNTER — Other Ambulatory Visit: Payer: Self-pay | Admitting: Cardiology

## 2012-04-24 ENCOUNTER — Other Ambulatory Visit: Payer: Self-pay | Admitting: *Deleted

## 2012-04-24 MED ORDER — LISINOPRIL 10 MG PO TABS: ORAL_TABLET | ORAL | Status: DC

## 2012-04-24 MED ORDER — LISINOPRIL 10 MG PO TABS: 10.0000 mg | ORAL_TABLET | Freq: Every day | ORAL | Status: DC

## 2012-04-26 ENCOUNTER — Ambulatory Visit (INDEPENDENT_AMBULATORY_CARE_PROVIDER_SITE_OTHER): Payer: Medicare Other | Admitting: *Deleted

## 2012-04-26 ENCOUNTER — Encounter: Payer: Self-pay | Admitting: Internal Medicine

## 2012-04-26 DIAGNOSIS — I472 Ventricular tachycardia: Secondary | ICD-10-CM

## 2012-04-26 LAB — ICD DEVICE OBSERVATION
BRDY-0002RV: 45 {beats}/min
DEVICE MODEL ICD: 347221
TOT-0006: 20130924000000
TOT-0007: 2
TOT-0009: 6
TOT-0010: 54
TZAT-0001SLOWVT: 1
TZAT-0004FASTVT: 8
TZAT-0004SLOWVT: 8
TZAT-0012FASTVT: 200 ms
TZAT-0013FASTVT: 1
TZAT-0020FASTVT: 1 ms
TZON-0003SLOWVT: 460 ms
TZON-0004FASTVT: 12
TZON-0005FASTVT: 6
TZON-0010SLOWVT: 80 ms
TZST-0001FASTVT: 3
TZST-0001FASTVT: 4
TZST-0001FASTVT: 5
TZST-0003FASTVT: 36 J
TZST-0003FASTVT: 36 J

## 2012-04-26 NOTE — Patient Instructions (Addendum)
Return office visit 07/25/12 @ 11:00am with the device clinic.

## 2012-04-26 NOTE — Progress Notes (Signed)
ICD check 

## 2012-05-08 ENCOUNTER — Ambulatory Visit (INDEPENDENT_AMBULATORY_CARE_PROVIDER_SITE_OTHER): Payer: Medicare Other | Admitting: *Deleted

## 2012-05-08 DIAGNOSIS — I4891 Unspecified atrial fibrillation: Secondary | ICD-10-CM

## 2012-05-08 DIAGNOSIS — Z7901 Long term (current) use of anticoagulants: Secondary | ICD-10-CM

## 2012-05-08 LAB — POCT INR: INR: 2.3

## 2012-05-17 ENCOUNTER — Ambulatory Visit (INDEPENDENT_AMBULATORY_CARE_PROVIDER_SITE_OTHER): Payer: Medicare Other | Admitting: Cardiology

## 2012-05-17 ENCOUNTER — Other Ambulatory Visit: Payer: Self-pay | Admitting: *Deleted

## 2012-05-17 ENCOUNTER — Encounter: Payer: Self-pay | Admitting: Cardiology

## 2012-05-17 VITALS — BP 146/74 | HR 58 | Ht 73.0 in | Wt 236.0 lb

## 2012-05-17 DIAGNOSIS — E785 Hyperlipidemia, unspecified: Secondary | ICD-10-CM

## 2012-05-17 DIAGNOSIS — R0602 Shortness of breath: Secondary | ICD-10-CM

## 2012-05-17 DIAGNOSIS — I4891 Unspecified atrial fibrillation: Secondary | ICD-10-CM

## 2012-05-17 DIAGNOSIS — I5022 Chronic systolic (congestive) heart failure: Secondary | ICD-10-CM

## 2012-05-17 DIAGNOSIS — I472 Ventricular tachycardia, unspecified: Secondary | ICD-10-CM

## 2012-05-17 DIAGNOSIS — I509 Heart failure, unspecified: Secondary | ICD-10-CM

## 2012-05-17 DIAGNOSIS — I2510 Atherosclerotic heart disease of native coronary artery without angina pectoris: Secondary | ICD-10-CM

## 2012-05-17 DIAGNOSIS — Z7901 Long term (current) use of anticoagulants: Secondary | ICD-10-CM

## 2012-05-17 LAB — LIPID PANEL
Total CHOL/HDL Ratio: 4
Triglycerides: 148 mg/dL (ref 0.0–149.0)

## 2012-05-17 LAB — BASIC METABOLIC PANEL
Calcium: 9.3 mg/dL (ref 8.4–10.5)
GFR: 39.73 mL/min — ABNORMAL LOW (ref >60.00)
Glucose, Bld: 161 mg/dL — ABNORMAL HIGH (ref 70–99)
Potassium: 5.1 mEq/L (ref 3.5–5.1)
Sodium: 135 mEq/L (ref 135–145)

## 2012-05-17 LAB — BRAIN NATRIURETIC PEPTIDE: Pro B Natriuretic peptide (BNP): 92 pg/mL (ref 0.0–100.0)

## 2012-05-17 MED ORDER — CARVEDILOL 6.25 MG PO TABS: 6.2500 mg | ORAL_TABLET | Freq: Two times a day (BID) | ORAL | Status: DC

## 2012-05-17 MED ORDER — ATORVASTATIN CALCIUM 20 MG PO TABS: 20.0000 mg | ORAL_TABLET | Freq: Every day | ORAL | Status: DC

## 2012-05-17 NOTE — Patient Instructions (Addendum)
Increase coreg(carvedilol) to 6.25mg  two times a day. You can take 2 of your 3.125mg  tablets two times a day and use your current supply.  Your physician recommends that you have lab work today--BMET/BNP/Lipid profile.  Stop simvastatin(zocor).  Start atorvastatin (lipitor) 20mg  daily.  Your physician recommends that you return for a FASTING lipid profile /liver profile 2 months after your start atorvastatin.  Your physician wants you to follow-up in: 4 months with Dr Shirlee Latch. (May 2014). You will receive a reminder letter in the mail two months in advance. If you don't receive a letter, please call our office to schedule the follow-up appointment.

## 2012-05-17 NOTE — Progress Notes (Signed)
Patient ID: [name] [name], male   DOB: 08-Feb-1930, 79 y.o.   MRN: [id number] PCP: Dr. Manus Gunning  79 yo with history of CAD, ischemic cardiomyopathy, and paroxysmal atrial fibrillation presents for cardiology followup.  He is in NSR today on sotalol.  No recent tachypalpitations.  Stable exertional dyspnea: short of breath with more than one flight of steps or when walking up a hill.  No problem on flat ground.  Mood is doing better.  No chest pain. Weight is up 2 lbs since last appointment but he was wearing a heavy coat.  His PSA has been rising and he is following up with his urologist about this.   ECG: NSR, LAFB, old ASMI, anterior and lateral TWIs, QTc interval normal   Labs (4/12): LDL 56, HDL 31 Labs (10/12): K 4.4, creatinine 1.2, TSH normal, HCT 48 Labs (11/12): K 4.3, creatinine 1.2 (creatinine clearance > 60), LFTs normal, LDL 53, HDL 31 Labs (3/13): K 4.8, creatinine 1.4, Mg 2.1 Labs (6/13): HDL 35, LDL 63 Labs (7/13): BNP 112, K 4.8, creatinine 1.4 Labs (10/13): K 5, creatinine 1.3, BNP 90  PMH: 1.  CAD: Anterior MI 1995.  Myoview (6/09) with fixed anterior and apical defect, EF 36%.  2.  LV mural thrombus noted in 2006.  Patient had CVA related to this.  He is on chronic coumadin.  3.  Ischemic cardiomyopathy: Echo (10/12) with EF 30-35%, mild LV hypertrophy, apical septal and anterior akinesis, no LV thrombus seen, mild MR.  Patient has a Secondary school teacher ICD.  4.  H/o VT 5.  Prostate cancer s/p radiation 6.  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  S/p multiple surgeries for excision and right nephrectomy.  7.  PFO 8.  Atrial fibrillation: Paroxysmal.  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   9.  HTN 10. Hyperlipidemia 11. Diabetes mellitus  12. 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  Has 3 sons.  Quit smoking in 1991.   FH: No premature CAD  ROS: All systems reviewed and negative except as per HPI.      Current Outpatient Prescriptions  Medication Sig Dispense Refill  . amLODipine (NORVASC) 5 MG tablet Take 1 tablet (5 mg total) by mouth 2 (two) times daily.  180 tablet  3  . aspirin 81 MG tablet Take 81 mg by mouth daily.        . citalopram (CELEXA) 10 MG tablet Take 10 mg by mouth daily.      . furosemide (LASIX) 20 MG tablet (1/2) 40mg  tablet every other day      . lisinopril (PRINIVIL,ZESTRIL) 10 MG tablet Take 1 tablet (10 mg total) by mouth daily.  30 tablet  6  . sotalol (BETAPACE) 120 MG tablet Take 1 tablet (120 mg total) by mouth 2 (two) times daily.  180 tablet  3  . spironolactone (ALDACTONE) 25 MG tablet Take 1 tablet (25 mg total) by mouth daily.  90 tablet  3  . warfarin (COUMADIN) 5 MG tablet TAKE AS DIRECTED  90 tablet  0  . atorvastatin (LIPITOR) 20 MG tablet Take 1 tablet (20 mg total) by mouth daily.  90 tablet  0  . carvedilol (COREG) 6.25 MG tablet Take 1 tablet (6.25 mg total) by mouth 2 (two) times daily.  180 tablet  3    BP 146/74  Pulse 58  Ht 6\' 1"  (1.854 m)  Wt 236 lb (107.049  kg)  BMI 31.14 kg/m2 General: NAD Neck: No JVD, no thyromegaly or thyroid nodule.  Lungs: Clear to auscultation bilaterally with normal respiratory effort. CV: Nondisplaced PMI.  Heart regular S1/S2, no S3/S4, 1/6 SEM.  Trace ankle edema bilaterally.  No carotid bruit.  Normal pedal pulses.  Abdomen: Soft, nontender, no hepatosplenomegaly, no distention.  Neurologic: Alert and oriented x 3.  Psych: Normal affect. Extremities: No clubbing or cyanosis.   Assessment/Plan:  Atrial fibrillation Paroxysmal. He is in NSR today with no recent symptoms concerning for atrial fibrillation. QT interval is normal on sotalol.  Continue warfarin. Would use this rather than NOAC agent given h/o LV thrombus.   CAD Stable with no chest pain. Continue ASA 81, lisinopril, Coreg, and statin. Hyperlipidemia  He is on simvastatin 40 along with amlodipine which can lead to an increased risk of  side effects.  I am going to stop simvastatin and start atorvastatin 20 mg daily with lipids/LFTs in 2 months.  Systolic CHF, chronic  NYHA class II symptoms without significant volume overload. Continue lisinopril and spironolactone.  Will increase Coreg to 6.25 mg bid. Has ICD. I am not going to make any changes to his Lasix dosing.  BMET/BNP today, need to follow K closely (has been high normal).   Marca Ancona 05/17/2012 1:25 PM

## 2012-05-24 ENCOUNTER — Other Ambulatory Visit (INDEPENDENT_AMBULATORY_CARE_PROVIDER_SITE_OTHER): Payer: Medicare Other

## 2012-05-24 DIAGNOSIS — I5022 Chronic systolic (congestive) heart failure: Secondary | ICD-10-CM

## 2012-05-24 LAB — BASIC METABOLIC PANEL
CO2: 27 mEq/L (ref 19–32)
Chloride: 103 mEq/L (ref 96–112)
Creatinine, Ser: 1.3 mg/dL (ref 0.4–1.5)
Glucose, Bld: 214 mg/dL — ABNORMAL HIGH (ref 70–99)

## 2012-05-25 NOTE — Addendum Note (Signed)
Addended by: Micki Riley C on: 05/25/2012 02:37 PM   Modules accepted: Orders

## 2012-06-05 ENCOUNTER — Ambulatory Visit (INDEPENDENT_AMBULATORY_CARE_PROVIDER_SITE_OTHER): Payer: Medicare Other | Admitting: *Deleted

## 2012-06-05 DIAGNOSIS — I4891 Unspecified atrial fibrillation: Secondary | ICD-10-CM

## 2012-06-05 DIAGNOSIS — Z7901 Long term (current) use of anticoagulants: Secondary | ICD-10-CM

## 2012-06-05 LAB — POCT INR: INR: 2.1

## 2012-07-10 ENCOUNTER — Ambulatory Visit (INDEPENDENT_AMBULATORY_CARE_PROVIDER_SITE_OTHER): Payer: Medicare Other

## 2012-07-10 DIAGNOSIS — Z7901 Long term (current) use of anticoagulants: Secondary | ICD-10-CM

## 2012-07-10 DIAGNOSIS — I4891 Unspecified atrial fibrillation: Secondary | ICD-10-CM

## 2012-07-10 LAB — POCT INR: INR: 1.8

## 2012-07-25 ENCOUNTER — Ambulatory Visit (INDEPENDENT_AMBULATORY_CARE_PROVIDER_SITE_OTHER): Payer: Medicare Other | Admitting: *Deleted

## 2012-07-25 ENCOUNTER — Other Ambulatory Visit: Payer: Self-pay | Admitting: Internal Medicine

## 2012-07-25 ENCOUNTER — Encounter: Payer: Self-pay | Admitting: Internal Medicine

## 2012-07-25 DIAGNOSIS — I472 Ventricular tachycardia: Secondary | ICD-10-CM

## 2012-07-25 LAB — ICD DEVICE OBSERVATION
BATTERY VOLTAGE: 2.53 V
BRDY-0002RV: 45 {beats}/min
CHARGE TIME: 13 s
RV LEAD IMPEDENCE ICD: 550 Ohm
TOT-0006: 20131219000000
TOT-0007: 2
TZAT-0001FASTVT: 1
TZAT-0001SLOWVT: 1
TZAT-0012FASTVT: 200 ms
TZAT-0013FASTVT: 1
TZAT-0018SLOWVT: NEGATIVE
TZAT-0019SLOWVT: 7.5 V
TZAT-0020FASTVT: 1 ms
TZAT-0020SLOWVT: 1 ms
TZON-0003SLOWVT: 460 ms
TZON-0004SLOWVT: 16
TZON-0005FASTVT: 6
TZON-0010SLOWVT: 80 ms
TZST-0001FASTVT: 2
TZST-0001FASTVT: 4
TZST-0001FASTVT: 5
TZST-0003FASTVT: 22.5 J
TZST-0003FASTVT: 36 J
VENTRICULAR PACING ICD: 1 pct

## 2012-07-25 NOTE — Progress Notes (Signed)
ICD check 

## 2012-07-30 ENCOUNTER — Other Ambulatory Visit (INDEPENDENT_AMBULATORY_CARE_PROVIDER_SITE_OTHER): Payer: Medicare Other

## 2012-07-30 DIAGNOSIS — I2510 Atherosclerotic heart disease of native coronary artery without angina pectoris: Secondary | ICD-10-CM

## 2012-07-30 DIAGNOSIS — I5022 Chronic systolic (congestive) heart failure: Secondary | ICD-10-CM

## 2012-07-30 LAB — LIPID PANEL
HDL: 27.4 mg/dL — ABNORMAL LOW (ref >39.00)
LDL Cholesterol: 55 mg/dL (ref 0–99)
Total CHOL/HDL Ratio: 4
Triglycerides: 143 mg/dL (ref 0.0–149.0)
VLDL: 28.6 mg/dL (ref 0.0–40.0)

## 2012-07-30 LAB — HEPATIC FUNCTION PANEL
Alkaline Phosphatase: 66 U/L (ref 39–117)
Bilirubin, Direct: 0.1 mg/dL (ref 0.0–0.3)
Total Bilirubin: 0.6 mg/dL (ref 0.3–1.2)

## 2012-08-07 ENCOUNTER — Ambulatory Visit (INDEPENDENT_AMBULATORY_CARE_PROVIDER_SITE_OTHER): Payer: Medicare Other | Admitting: *Deleted

## 2012-08-07 DIAGNOSIS — I4891 Unspecified atrial fibrillation: Secondary | ICD-10-CM

## 2012-08-07 DIAGNOSIS — Z7901 Long term (current) use of anticoagulants: Secondary | ICD-10-CM

## 2012-08-07 LAB — POCT INR: INR: 2.5

## 2012-08-13 ENCOUNTER — Other Ambulatory Visit: Payer: Self-pay | Admitting: Cardiology

## 2012-08-15 ENCOUNTER — Telehealth: Payer: Self-pay | Admitting: Cardiology

## 2012-08-15 ENCOUNTER — Other Ambulatory Visit: Payer: Self-pay | Admitting: *Deleted

## 2012-08-15 DIAGNOSIS — I5020 Unspecified systolic (congestive) heart failure: Secondary | ICD-10-CM

## 2012-08-15 MED ORDER — FUROSEMIDE 20 MG PO TABS: ORAL_TABLET | ORAL | Status: DC

## 2012-08-15 NOTE — Telephone Encounter (Signed)
Patient has been getting 40 mg Lasix tablets per pharmacy. Left message to call back to verify dose as 40 mg 1/2 every other day.

## 2012-08-15 NOTE — Telephone Encounter (Signed)
New Prob    Needs some clarification on directions for LASIX. Would like to speak to nurse.

## 2012-08-20 ENCOUNTER — Other Ambulatory Visit: Payer: Self-pay | Admitting: Cardiology

## 2012-09-04 ENCOUNTER — Ambulatory Visit (INDEPENDENT_AMBULATORY_CARE_PROVIDER_SITE_OTHER): Payer: Medicare Other | Admitting: *Deleted

## 2012-09-04 DIAGNOSIS — I4891 Unspecified atrial fibrillation: Secondary | ICD-10-CM

## 2012-09-04 DIAGNOSIS — Z7901 Long term (current) use of anticoagulants: Secondary | ICD-10-CM

## 2012-09-04 LAB — POCT INR: INR: 2.2

## 2012-09-07 NOTE — Telephone Encounter (Signed)
Reviewed chart and looks like medication refilled

## 2012-09-11 ENCOUNTER — Ambulatory Visit (INDEPENDENT_AMBULATORY_CARE_PROVIDER_SITE_OTHER): Payer: Medicare Other | Admitting: Cardiology

## 2012-09-11 ENCOUNTER — Encounter: Payer: Self-pay | Admitting: Cardiology

## 2012-09-11 VITALS — BP 126/72 | HR 58 | Ht 73.0 in | Wt 238.0 lb

## 2012-09-11 DIAGNOSIS — I4891 Unspecified atrial fibrillation: Secondary | ICD-10-CM

## 2012-09-11 DIAGNOSIS — E785 Hyperlipidemia, unspecified: Secondary | ICD-10-CM

## 2012-09-11 DIAGNOSIS — I5022 Chronic systolic (congestive) heart failure: Secondary | ICD-10-CM

## 2012-09-11 DIAGNOSIS — I509 Heart failure, unspecified: Secondary | ICD-10-CM

## 2012-09-11 DIAGNOSIS — I2510 Atherosclerotic heart disease of native coronary artery without angina pectoris: Secondary | ICD-10-CM

## 2012-09-11 LAB — BASIC METABOLIC PANEL
Chloride: 98 mEq/L (ref 96–112)
Creatinine, Ser: 1.4 mg/dL (ref 0.4–1.5)
GFR: 50.04 mL/min — ABNORMAL LOW (ref >60.00)
Potassium: 4.8 mEq/L (ref 3.5–5.1)

## 2012-09-11 MED ORDER — SPIRONOLACTONE 25 MG PO TABS: 12.5000 mg | ORAL_TABLET | Freq: Every day | ORAL | Status: DC

## 2012-09-11 NOTE — Progress Notes (Signed)
Patient ID: [name] [name], male   DOB: 1929/12/29, 79 y.o.   MRN: [id number] PCP: Dr. Manus Gunning  79 yo with history of CAD, ischemic cardiomyopathy, and paroxysmal atrial fibrillation presents for cardiology followup.  He is in NSR today on sotalol.  No recent tachypalpitations.  Stable exertional dyspnea: short of breath with more than one flight of steps or when walking up a hill.  No problem on flat ground.  No chest pain. Weight is up 2 lbs since last appointment.   ECG: NSR, old ASMI, anterior and lateral TWIs, QTc interval normal   Labs (4/12): LDL 56, HDL 31 Labs (10/12): K 4.4, creatinine 1.2, TSH normal, HCT 48 Labs (11/12): K 4.3, creatinine 1.2 (creatinine clearance > 60), LFTs normal, LDL 53, HDL 31 Labs (3/13): K 4.8, creatinine 1.4, Mg 2.1 Labs (6/13): HDL 35, LDL 63 Labs (7/13): BNP 112, K 4.8, creatinine 1.4 Labs (10/13): K 5, creatinine 1.3, BNP 90 Labs (1/14): K 4.6, creatinine 1.3 Labs (3/14): LDL 55, HDL 27  PMH: 1.  CAD: Anterior MI 1995.  Myoview (6/09) with fixed anterior and apical defect, EF 36%.  2.  LV mural thrombus noted in 2006.  Patient had CVA related to this.  He is on chronic coumadin.  3.  Ischemic cardiomyopathy: Echo (10/12) with EF 30-35%, mild LV hypertrophy, apical septal and anterior akinesis, no LV thrombus seen, mild MR.  Patient has a Secondary school teacher ICD.  4.  H/o VT 5.  Prostate cancer s/p radiation 6.  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  S/p multiple surgeries for excision and right nephrectomy.  7.  PFO 8.  Atrial fibrillation: Paroxysmal.  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   9.  HTN 10. Hyperlipidemia 11. Diabetes mellitus  12. 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  Lives in [place].  Widower.  Has 3 sons.  Quit smoking in 1991.   FH: No premature CAD    Current Outpatient Prescriptions  Medication Sig Dispense Refill  . amLODipine (NORVASC) 5 MG tablet Take 1 tablet (5 mg total)  by mouth 2 (two) times daily.  180 tablet  3  . aspirin 81 MG tablet Take 81 mg by mouth daily.        Marland Kitchen atorvastatin (LIPITOR) 20 MG tablet TAKE ONE TABLET BY MOUTH EVERY DAY  90 tablet  1  . carvedilol (COREG) 6.25 MG tablet Take 1 tablet (6.25 mg total) by mouth 2 (two) times daily.  180 tablet  3  . citalopram (CELEXA) 10 MG tablet Take 10 mg by mouth daily.      . furosemide (LASIX) 20 MG tablet (1/2) 40mg  tablet every other day  90 tablet  3  . lisinopril (PRINIVIL,ZESTRIL) 5 MG tablet Pt takes 1/2 of a 10mg  tablet      . sotalol (BETAPACE) 120 MG tablet Take 1 tablet (120 mg total) by mouth 2 (two) times daily.  180 tablet  3  . spironolactone (ALDACTONE) 25 MG tablet Take 0.5 tablets (12.5 mg total) by mouth daily.  45 tablet  3  . warfarin (COUMADIN) 5 MG tablet TAKE AS DIRECTED  90 tablet  0   No current facility-administered medications for this visit.    BP 126/72  Pulse 58  Ht 6\' 1"  (1.854 m)  Wt 238 lb (107.956 kg)  BMI 31.41 kg/m2 General: NAD Neck: No JVD, no thyromegaly or thyroid nodule.  Lungs: Clear to auscultation bilaterally with normal respiratory  effort. CV: Nondisplaced PMI.  Heart regular S1/S2, no S3/S4, 1/6 SEM.  1+ ankle edema bilaterally.  No carotid bruit.  Normal pedal pulses.  Abdomen: Soft, nontender, no hepatosplenomegaly, no distention.  Neurologic: Alert and oriented x 3.  Psych: Normal affect. Extremities: No clubbing or cyanosis.   Assessment/Plan:  Atrial fibrillation Paroxysmal. He is in NSR today with no recent symptoms concerning for atrial fibrillation. QT interval is normal on sotalol.  Continue warfarin. Would use this rather than NOAC agent given h/o LV thrombus.   CAD Stable with no chest pain. Continue ASA 81, lisinopril, Coreg, and statin. Hyperlipidemia  Continue atorvastatin.  Good lipids in 3/14.  Systolic CHF, chronic  NYHA class II symptoms without significant volume overload. Continue Coreg, lisinopril and spironolactone.   Has ICD. I am not going to make any changes to his Lasix dosing.  BMET/BNP today, need to follow K closely => has been high normal in the past so will not increase lisinopril or spironolactone.  HR in 50s so will hold off on titration of Coreg.   Marca Ancona 09/11/2012

## 2012-09-11 NOTE — Patient Instructions (Addendum)
Your physician recommends that you have  lab work today--BMET    Your physician recommends that you schedule a follow-up appointment in: 4 months with Dr McLean.    

## 2012-09-25 ENCOUNTER — Ambulatory Visit (INDEPENDENT_AMBULATORY_CARE_PROVIDER_SITE_OTHER): Payer: Medicare Other | Admitting: Cardiology

## 2012-09-25 ENCOUNTER — Encounter: Payer: Self-pay | Admitting: Cardiology

## 2012-09-25 VITALS — Ht 73.0 in | Wt 239.4 lb

## 2012-09-25 DIAGNOSIS — Z4502 Encounter for adjustment and management of automatic implantable cardiac defibrillator: Secondary | ICD-10-CM

## 2012-09-25 DIAGNOSIS — Z95810 Presence of automatic (implantable) cardiac defibrillator: Secondary | ICD-10-CM

## 2012-09-25 DIAGNOSIS — I472 Ventricular tachycardia, unspecified: Secondary | ICD-10-CM

## 2012-09-25 LAB — ICD DEVICE OBSERVATION
BATTERY VOLTAGE: 2.53 V
BRDY-0002RV: 45 {beats}/min
DEVICE MODEL ICD: 347221
FVT: 0
PACEART VT: 1
RV LEAD AMPLITUDE: 11.2 mv
TZAT-0001SLOWVT: 1
TZAT-0004SLOWVT: 8
TZAT-0012FASTVT: 200 ms
TZAT-0012SLOWVT: 200 ms
TZAT-0013FASTVT: 1
TZAT-0018FASTVT: NEGATIVE
TZAT-0018SLOWVT: NEGATIVE
TZAT-0019FASTVT: 7.5 V
TZAT-0020FASTVT: 1 ms
TZON-0003FASTVT: 315 ms
TZON-0003SLOWVT: 460 ms
TZON-0004FASTVT: 12
TZON-0004SLOWVT: 16
TZON-0005FASTVT: 6
TZON-0010SLOWVT: 80 ms
TZST-0001FASTVT: 2
TZST-0001FASTVT: 4
TZST-0003FASTVT: 36 J
TZST-0003FASTVT: 36 J
VENTRICULAR PACING ICD: 0 pct
VF: 0

## 2012-09-25 NOTE — Progress Notes (Signed)
ICD check/device clinic visit. [name] has no complaints. Normal ICD function. Battery nearing ERI. See PaceArt report. Return to device clinic in 2 months.

## 2012-09-25 NOTE — Patient Instructions (Signed)
You are scheduled for device check on Monday, July 21 at 9:30 AM.

## 2012-10-02 ENCOUNTER — Ambulatory Visit (INDEPENDENT_AMBULATORY_CARE_PROVIDER_SITE_OTHER): Payer: Medicare Other | Admitting: *Deleted

## 2012-10-02 DIAGNOSIS — Z7901 Long term (current) use of anticoagulants: Secondary | ICD-10-CM

## 2012-10-02 DIAGNOSIS — I4891 Unspecified atrial fibrillation: Secondary | ICD-10-CM

## 2012-10-16 ENCOUNTER — Ambulatory Visit (INDEPENDENT_AMBULATORY_CARE_PROVIDER_SITE_OTHER): Payer: Medicare Other

## 2012-10-16 DIAGNOSIS — I4891 Unspecified atrial fibrillation: Secondary | ICD-10-CM

## 2012-10-16 DIAGNOSIS — Z7901 Long term (current) use of anticoagulants: Secondary | ICD-10-CM

## 2012-10-16 LAB — POCT INR: INR: 2.3

## 2012-10-24 ENCOUNTER — Encounter: Payer: Self-pay | Admitting: Internal Medicine

## 2012-11-13 ENCOUNTER — Ambulatory Visit (INDEPENDENT_AMBULATORY_CARE_PROVIDER_SITE_OTHER): Payer: Medicare Other | Admitting: *Deleted

## 2012-11-13 DIAGNOSIS — I4891 Unspecified atrial fibrillation: Secondary | ICD-10-CM

## 2012-11-13 DIAGNOSIS — Z7901 Long term (current) use of anticoagulants: Secondary | ICD-10-CM

## 2012-11-13 LAB — POCT INR: INR: 2.1

## 2012-11-26 ENCOUNTER — Encounter: Payer: Self-pay | Admitting: Internal Medicine

## 2012-11-26 ENCOUNTER — Ambulatory Visit (INDEPENDENT_AMBULATORY_CARE_PROVIDER_SITE_OTHER): Payer: Medicare Other | Admitting: *Deleted

## 2012-11-26 DIAGNOSIS — Z45010 Encounter for checking and testing of cardiac pacemaker pulse generator [battery]: Secondary | ICD-10-CM

## 2012-11-26 DIAGNOSIS — Z45018 Encounter for adjustment and management of other part of cardiac pacemaker: Secondary | ICD-10-CM

## 2012-11-27 NOTE — Progress Notes (Signed)
Battery check only in clinic. Battery @ 2.5V, ERI is 2.45V. Changed VF NID from 8 to 16, VT-1 NID from 12 to 30, & VT-2 NID from 16 to 30. ROV w/ Dr. Ladona Ridgel for 1 yr f/u Sept/2014 [name] [name]

## 2012-11-29 LAB — ICD DEVICE OBSERVATION
TZAT-0001FASTVT: 1
TZAT-0004SLOWVT: 8
TZAT-0018SLOWVT: NEGATIVE
TZAT-0019SLOWVT: 7.5 V
TZAT-0020FASTVT: 1 ms
TZAT-0020SLOWVT: 1 ms
TZON-0003SLOWVT: 460 ms
TZON-0004SLOWVT: 30
TZON-0005FASTVT: 6
TZON-0005SLOWVT: 6
TZON-0010SLOWVT: 80 ms
TZST-0001FASTVT: 2
TZST-0001FASTVT: 4
TZST-0003FASTVT: 22.5 J
TZST-0003FASTVT: 36 J
TZST-0003FASTVT: 36 J

## 2012-12-11 ENCOUNTER — Ambulatory Visit (INDEPENDENT_AMBULATORY_CARE_PROVIDER_SITE_OTHER): Payer: Medicare Other | Admitting: *Deleted

## 2012-12-11 DIAGNOSIS — Z7901 Long term (current) use of anticoagulants: Secondary | ICD-10-CM

## 2012-12-11 DIAGNOSIS — I4891 Unspecified atrial fibrillation: Secondary | ICD-10-CM

## 2013-01-09 ENCOUNTER — Other Ambulatory Visit: Payer: Self-pay | Admitting: Cardiology

## 2013-01-18 ENCOUNTER — Ambulatory Visit (INDEPENDENT_AMBULATORY_CARE_PROVIDER_SITE_OTHER): Payer: Medicare Other

## 2013-01-18 ENCOUNTER — Encounter: Payer: Self-pay | Admitting: Cardiology

## 2013-01-18 ENCOUNTER — Ambulatory Visit (INDEPENDENT_AMBULATORY_CARE_PROVIDER_SITE_OTHER): Payer: Medicare Other | Admitting: Cardiology

## 2013-01-18 VITALS — BP 130/82 | HR 61 | Ht 73.0 in | Wt 238.0 lb

## 2013-01-18 DIAGNOSIS — I4891 Unspecified atrial fibrillation: Secondary | ICD-10-CM

## 2013-01-18 DIAGNOSIS — I5022 Chronic systolic (congestive) heart failure: Secondary | ICD-10-CM

## 2013-01-18 DIAGNOSIS — I2510 Atherosclerotic heart disease of native coronary artery without angina pectoris: Secondary | ICD-10-CM

## 2013-01-18 DIAGNOSIS — Z7901 Long term (current) use of anticoagulants: Secondary | ICD-10-CM

## 2013-01-18 DIAGNOSIS — I509 Heart failure, unspecified: Secondary | ICD-10-CM

## 2013-01-18 LAB — POCT INR: INR: 2.7

## 2013-01-18 MED ORDER — LISINOPRIL 5 MG PO TABS: ORAL_TABLET | ORAL | Status: DC

## 2013-01-18 NOTE — Progress Notes (Signed)
Patient ID: [name] [name], male   DOB: Sep 08, 1929, 79 y.o.   MRN: [id number] PCP: Dr. Manus Gunning  79 yo with history of CAD, ischemic cardiomyopathy, and paroxysmal atrial fibrillation presents for cardiology followup.  He is in NSR today on sotalol.  No recent tachypalpitations.  Stable exertional dyspnea: short of breath with more than one flight of steps or when walking up a hill.  No problem on flat ground.  No chest pain. Weight is stable. He is getting out some to shop, eating at [employer] on occasion.   ECG: NSR, old ASMI, LAFB, QTc interval normal   Labs (4/12): LDL 56, HDL 31 Labs (10/12): K 4.4, creatinine 1.2, TSH normal, HCT 48 Labs (11/12): K 4.3, creatinine 1.2 (creatinine clearance > 60), LFTs normal, LDL 53, HDL 31 Labs (3/13): K 4.8, creatinine 1.4, Mg 2.1 Labs (6/13): HDL 35, LDL 63 Labs (7/13): BNP 112, K 4.8, creatinine 1.4 Labs (10/13): K 5, creatinine 1.3, BNP 90 Labs (1/14): K 4.6, creatinine 1.3 Labs (3/14): LDL 55, HDL 27 Labs (5/14): K 4.8, creatinine 1.4  PMH: 1.  CAD: Anterior MI 1995.  Myoview (6/09) with fixed anterior and apical defect, EF 36%.  2.  LV mural thrombus noted in 2006.  Patient had CVA related to this.  He is on chronic coumadin.  3.  Ischemic cardiomyopathy: Echo (10/12) with EF 30-35%, mild LV hypertrophy, apical septal and anterior akinesis, no LV thrombus seen, mild MR.  Patient has a Secondary school teacher ICD.  4.  H/o VT 5.  Prostate cancer s/p radiation 6.  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  S/p multiple surgeries for excision and right nephrectomy.  7.  PFO 8.  Atrial fibrillation: Paroxysmal.  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   9.  HTN 10. Hyperlipidemia 11. Diabetes mellitus  12. 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  Has 3 sons.  Quit smoking in 1991.   FH: No premature CAD   ROS: All systems reviewed and negative except as per HPI.    Current Outpatient  Prescriptions  Medication Sig Dispense Refill  . amLODipine (NORVASC) 5 MG tablet Take 1 tablet (5 mg total) by mouth 2 (two) times daily.  180 tablet  3  . aspirin 81 MG tablet Take 81 mg by mouth daily.        Marland Kitchen atorvastatin (LIPITOR) 20 MG tablet TAKE ONE TABLET BY MOUTH EVERY DAY  90 tablet  1  . carvedilol (COREG) 6.25 MG tablet Take 1 tablet (6.25 mg total) by mouth 2 (two) times daily.  180 tablet  3  . citalopram (CELEXA) 10 MG tablet Take 10 mg by mouth daily.      . furosemide (LASIX) 20 MG tablet (1/2) 40mg  tablet every other day  90 tablet  3  . sotalol (BETAPACE) 120 MG tablet Take 1 tablet (120 mg total) by mouth 2 (two) times daily.  180 tablet  3  . spironolactone (ALDACTONE) 25 MG tablet Take 0.5 tablets (12.5 mg total) by mouth daily.  45 tablet  3  . warfarin (COUMADIN) 5 MG tablet TAKE AS DIRECTED  90 tablet  1  . lisinopril (PRINIVIL,ZESTRIL) 5 MG tablet 1 and 1/2 tablet daily (total 7.5mg )  135 tablet  3   No current facility-administered medications for this visit.    BP 130/82  Pulse 61  Ht 6\' 1"  (1.854 m)  Wt 107.956 kg (238 lb)  BMI 31.41 kg/m2 General: NAD Neck: No JVD, no thyromegaly or thyroid nodule.  Lungs: Clear to auscultation bilaterally with normal respiratory effort. CV: Nondisplaced PMI.  Heart regular S1/S2, no S3/S4, 1/6 SEM.  No edema.  No carotid bruit.  Normal pedal pulses.  Abdomen: Soft, nontender, no hepatosplenomegaly, no distention.  Neurologic: Alert and oriented x 3.  Psych: Normal affect. Extremities: No clubbing or cyanosis.   Assessment/Plan:  Atrial fibrillation Paroxysmal. He is in NSR today with no recent symptoms concerning for atrial fibrillation. QT interval is normal on sotalol.  Continue warfarin. Would use this rather than NOAC agent given h/o LV thrombus.   CAD Stable with no chest pain. Continue ASA 81, lisinopril, Coreg, and statin. Hyperlipidemia  Continue atorvastatin.  Good lipids in 3/14.  Systolic CHF, chronic   NYHA class II symptoms without significant volume overload. Continue Coreg and spironolactone.  Will increase lisinopril to 7.5 mg daily with BMET in 10 days (will watch K as it has been high normal).  Has ICD. I am not going to make any changes to his Lasix dosing.   CKD Check BMET after lisinopril increase.  Followup in 4 months.   Marca Ancona 01/18/2013

## 2013-01-18 NOTE — Patient Instructions (Addendum)
Increase lisinopril to 7.5mg  daily. This will be 1 and 1/2 of a 5mg  tablet daily.   Your physician recommends that you return for lab work in: 10 days--BMET.  Your physician wants you to follow-up in: 4 months with Dr Shirlee Latch. (January 2015). You will receive a reminder letter in the mail two months in advance. If you don't receive a letter, please call our office to schedule the follow-up appointment.

## 2013-01-29 ENCOUNTER — Encounter: Payer: Self-pay | Admitting: Internal Medicine

## 2013-01-29 ENCOUNTER — Encounter: Payer: Self-pay | Admitting: *Deleted

## 2013-01-29 ENCOUNTER — Ambulatory Visit (INDEPENDENT_AMBULATORY_CARE_PROVIDER_SITE_OTHER): Payer: Medicare Other | Admitting: Internal Medicine

## 2013-01-29 ENCOUNTER — Other Ambulatory Visit (INDEPENDENT_AMBULATORY_CARE_PROVIDER_SITE_OTHER): Payer: Medicare Other

## 2013-01-29 VITALS — BP 158/81 | HR 61 | Ht 73.5 in | Wt 240.0 lb

## 2013-01-29 DIAGNOSIS — I509 Heart failure, unspecified: Secondary | ICD-10-CM

## 2013-01-29 DIAGNOSIS — I5022 Chronic systolic (congestive) heart failure: Secondary | ICD-10-CM

## 2013-01-29 DIAGNOSIS — I2510 Atherosclerotic heart disease of native coronary artery without angina pectoris: Secondary | ICD-10-CM

## 2013-01-29 DIAGNOSIS — Z95810 Presence of automatic (implantable) cardiac defibrillator: Secondary | ICD-10-CM

## 2013-01-29 DIAGNOSIS — I4891 Unspecified atrial fibrillation: Secondary | ICD-10-CM

## 2013-01-29 DIAGNOSIS — I472 Ventricular tachycardia: Secondary | ICD-10-CM

## 2013-01-29 LAB — BASIC METABOLIC PANEL
CO2: 28 mEq/L (ref 19–32)
GFR: 46.97 mL/min — ABNORMAL LOW (ref >60.00)
Glucose, Bld: 184 mg/dL — ABNORMAL HIGH (ref 70–99)
Potassium: 5 mEq/L (ref 3.5–5.1)
Sodium: 136 mEq/L (ref 135–145)

## 2013-01-29 LAB — ICD DEVICE OBSERVATION
BRDY-0002RV: 45 {beats}/min
DEVICE MODEL ICD: 347221
RV LEAD AMPLITUDE: 10.6 mv
RV LEAD IMPEDENCE ICD: 560 Ohm
TOT-0006: 20140721000000
TOT-0007: 2
TOT-0009: 6
TOT-0010: 57
TZAT-0001SLOWVT: 1
TZAT-0004SLOWVT: 8
TZAT-0012FASTVT: 200 ms
TZAT-0012SLOWVT: 200 ms
TZAT-0013FASTVT: 1
TZAT-0018FASTVT: NEGATIVE
TZAT-0018SLOWVT: NEGATIVE
TZAT-0019FASTVT: 7.5 V
TZAT-0019SLOWVT: 7.5 V
TZAT-0020FASTVT: 1 ms
TZON-0003FASTVT: 315 ms
TZON-0003SLOWVT: 460 ms
TZON-0004FASTVT: 30
TZON-0004SLOWVT: 30
TZON-0005FASTVT: 6
TZON-0010SLOWVT: 80 ms
TZST-0001FASTVT: 2
TZST-0001FASTVT: 4
TZST-0003FASTVT: 36 J
TZST-0003FASTVT: 36 J

## 2013-01-29 NOTE — Patient Instructions (Addendum)
Generator change out on 02/20/13  See instructions sheet

## 2013-01-30 ENCOUNTER — Encounter: Payer: Self-pay | Admitting: Internal Medicine

## 2013-01-30 NOTE — Assessment & Plan Note (Signed)
His St. Jude ICD is at elective replacement. We will schedule generator change out in the next several weeks.

## 2013-01-30 NOTE — Assessment & Plan Note (Signed)
His ventricular arrhythmias have been stable. No change in medical therapy.

## 2013-01-30 NOTE — Assessment & Plan Note (Signed)
His chronic systolic heart failure remains class II. We discussed the importance of maintaining a low-sodium diet, and continuing his current medical therapy.

## 2013-01-30 NOTE — Progress Notes (Signed)
HPI [name] returns today for followup. He is a pleasant 79 yo man with a h/o chronic systolic heart failure, ventricular tachycardia, paroxysmal atrial fibrillation, and coronary artery disease. The patient is status post ICD implantation and has reached elective replacement. He returns today for followup. He has had no recent ICD shocks and has tolerated sotalol very well. He has minimal peripheral edema. He does admit to some sodium indiscretion and has class II heart failure symptoms. No syncope. Allergies  Allergen Reactions  . Penicillins      Current Outpatient Prescriptions  Medication Sig Dispense Refill  . amLODipine (NORVASC) 5 MG tablet Take 1 tablet (5 mg total) by mouth 2 (two) times daily.  180 tablet  3  . aspirin 81 MG tablet Take 81 mg by mouth daily.        Marland Kitchen atorvastatin (LIPITOR) 20 MG tablet TAKE ONE TABLET BY MOUTH EVERY DAY  90 tablet  1  . carvedilol (COREG) 6.25 MG tablet Take 1 tablet (6.25 mg total) by mouth 2 (two) times daily.  180 tablet  3  . citalopram (CELEXA) 10 MG tablet Take 10 mg by mouth daily.      . furosemide (LASIX) 20 MG tablet (1/2) 40mg  tablet every other day  90 tablet  3  . lisinopril (PRINIVIL,ZESTRIL) 5 MG tablet 1 and 1/2 tablet daily (total 7.5mg )  135 tablet  3  . sotalol (BETAPACE) 120 MG tablet Take 1 tablet (120 mg total) by mouth 2 (two) times daily.  180 tablet  3  . spironolactone (ALDACTONE) 25 MG tablet Take 0.5 tablets (12.5 mg total) by mouth daily.  45 tablet  3  . warfarin (COUMADIN) 5 MG tablet TAKE AS DIRECTED  90 tablet  1   No current facility-administered medications for this visit.     Past Medical History  Diagnosis Date  . PVCs (premature ventricular contractions)   . Heart palpitations   . Coronary artery disease   . Hypertension   . Atrial fibrillation   . Anxiety     CHRONIC  . Myocardial infarction 1995    remote anterior   . Ventricular tachycardia   . Bladder cancer   . Hyperthyroidism   . Patent  foramen ovale   . Mural thrombus of heart   . Genitourinary disease     CANCER  . Hyperlipidemia   . Stroke   . Left ventricular dysfunction     ROS:   All systems reviewed and negative except as noted in the HPI.   Past Surgical History  Procedure Laterality Date  . Icd  2007  . Kidney surgery      REMOVED  . Bladder      PART OF IT REMOVED  . Cardiac catheterization    . Prostatectomy      TOTAL  . Eye surgery  2000    CATARACT BOTH EYES  . Hernia repair  1956     Family History  Problem Relation Age of Onset  . Heart attack Father      History   Social History  . Marital Status: Married    Spouse Name: N/A    Number of Children: N/A  . Years of Education: N/A   Occupational History  . Not on file.   Social History Main Topics  . Smoking status: Former Smoker    Quit date: 05/09/1990  . Smokeless tobacco: Not on file  . Alcohol Use: No  . Drug Use: No  . Sexual  Activity: No   Other Topics Concern  . Not on file   Social History Narrative  . No narrative on file     BP 158/81  Pulse 61  Ht 6' 1.5" (1.867 m)  Wt 240 lb (108.863 kg)  BMI 31.23 kg/m2  Physical Exam:  Well appearing 79 year old woman, NAD HEENT: Unremarkable Neck:  6 cm JVD, no thyromegally Back:  No CVA tenderness Lungs:  Clear with no wheezes, rales, or rhonchi. HEART:  Regular rate rhythm, no murmurs, no rubs, no clicks Abd:  soft, positive bowel sounds, no organomegally, no rebound, no guarding Ext:  2 plus pulses, no edema, no cyanosis, no clubbing Skin:  No rashes no nodules Neuro:  CN II through XII intact, motor grossly intact   DEVICE  Normal device function.  See PaceArt for details. His device is at elective replacement.  Assess/Plan:

## 2013-02-04 ENCOUNTER — Other Ambulatory Visit: Payer: Self-pay | Admitting: Cardiology

## 2013-02-05 ENCOUNTER — Encounter: Payer: Self-pay | Admitting: Internal Medicine

## 2013-02-05 ENCOUNTER — Encounter: Payer: Medicare Other | Admitting: Internal Medicine

## 2013-02-13 ENCOUNTER — Ambulatory Visit (INDEPENDENT_AMBULATORY_CARE_PROVIDER_SITE_OTHER): Payer: Medicare Other | Admitting: *Deleted

## 2013-02-13 DIAGNOSIS — I472 Ventricular tachycardia: Secondary | ICD-10-CM

## 2013-02-13 LAB — CBC WITH DIFFERENTIAL/PLATELET
Basophils Relative: 1 % (ref 0.0–3.0)
Eosinophils Relative: 2.8 % (ref 0.0–5.0)
HCT: 40.5 % (ref 39.0–52.0)
Lymphs Abs: 1.5 10*3/uL (ref 0.7–4.0)
MCV: 92.9 fl (ref 78.0–100.0)
Monocytes Absolute: 0.5 10*3/uL (ref 0.1–1.0)
Platelets: 186 10*3/uL (ref 150.0–400.0)
RBC: 4.37 Mil/uL (ref 4.22–5.81)
RDW: 14.9 % — ABNORMAL HIGH (ref 11.5–14.6)
WBC: 7.6 10*3/uL (ref 4.5–10.5)

## 2013-02-13 LAB — PROTIME-INR: Prothrombin Time: 22.1 s — ABNORMAL HIGH (ref 10.2–12.4)

## 2013-02-13 LAB — BASIC METABOLIC PANEL
BUN: 20 mg/dL (ref 6–23)
CO2: 26 mEq/L (ref 19–32)
Chloride: 101 mEq/L (ref 96–112)
Potassium: 4.6 mEq/L (ref 3.5–5.1)

## 2013-02-14 ENCOUNTER — Encounter (HOSPITAL_COMMUNITY): Payer: Self-pay | Admitting: Pharmacist

## 2013-02-19 MED ORDER — VANCOMYCIN HCL 10 G IV SOLR
1500.0000 mg | INTRAVENOUS | Status: DC
  Filled 2013-02-19 (×3): qty 1500

## 2013-02-19 MED ORDER — SODIUM CHLORIDE 0.9 % IR SOLN
80.0000 mg | Status: DC
  Filled 2013-02-19: qty 2

## 2013-02-20 ENCOUNTER — Encounter (HOSPITAL_COMMUNITY)
Admission: RE | Disposition: A | Discharge status: Home / Self Care | Payer: Self-pay | Source: Ambulatory Visit | Attending: Internal Medicine

## 2013-02-20 ENCOUNTER — Other Ambulatory Visit: Payer: Self-pay

## 2013-02-20 ENCOUNTER — Ambulatory Visit (HOSPITAL_COMMUNITY)
Admission: RE | Admit: 2013-02-20 | Discharge: 2013-02-20 | Disposition: A | Discharge status: Home / Self Care | Payer: Medicare Other | Source: Ambulatory Visit | Attending: Internal Medicine | Admitting: Internal Medicine

## 2013-02-20 DIAGNOSIS — Z7901 Long term (current) use of anticoagulants: Secondary | ICD-10-CM | POA: Insufficient documentation

## 2013-02-20 DIAGNOSIS — I509 Heart failure, unspecified: Secondary | ICD-10-CM | POA: Insufficient documentation

## 2013-02-20 DIAGNOSIS — I472 Ventricular tachycardia, unspecified: Secondary | ICD-10-CM | POA: Insufficient documentation

## 2013-02-20 DIAGNOSIS — I5022 Chronic systolic (congestive) heart failure: Secondary | ICD-10-CM | POA: Insufficient documentation

## 2013-02-20 DIAGNOSIS — I2589 Other forms of chronic ischemic heart disease: Secondary | ICD-10-CM | POA: Insufficient documentation

## 2013-02-20 DIAGNOSIS — I10 Essential (primary) hypertension: Secondary | ICD-10-CM | POA: Insufficient documentation

## 2013-02-20 DIAGNOSIS — F411 Generalized anxiety disorder: Secondary | ICD-10-CM | POA: Insufficient documentation

## 2013-02-20 DIAGNOSIS — Z4502 Encounter for adjustment and management of automatic implantable cardiac defibrillator: Secondary | ICD-10-CM | POA: Insufficient documentation

## 2013-02-20 DIAGNOSIS — I252 Old myocardial infarction: Secondary | ICD-10-CM | POA: Insufficient documentation

## 2013-02-20 DIAGNOSIS — I4729 Other ventricular tachycardia: Secondary | ICD-10-CM | POA: Insufficient documentation

## 2013-02-20 DIAGNOSIS — Z859 Personal history of malignant neoplasm, unspecified: Secondary | ICD-10-CM | POA: Insufficient documentation

## 2013-02-20 DIAGNOSIS — I4891 Unspecified atrial fibrillation: Secondary | ICD-10-CM | POA: Insufficient documentation

## 2013-02-20 DIAGNOSIS — I2510 Atherosclerotic heart disease of native coronary artery without angina pectoris: Secondary | ICD-10-CM | POA: Insufficient documentation

## 2013-02-20 HISTORY — PX: IMPLANTABLE CARDIOVERTER DEFIBRILLATOR GENERATOR CHANGE: SHX5474

## 2013-02-20 LAB — PROTIME-INR: Prothrombin Time: 19.5 seconds — ABNORMAL HIGH (ref 11.6–15.2)

## 2013-02-20 LAB — SURGICAL PCR SCREEN
MRSA, PCR: NEGATIVE
Staphylococcus aureus: NEGATIVE

## 2013-02-20 SURGERY — IMPLANTABLE CARDIOVERTER DEFIBRILLATOR GENERATOR CHANGE
Anesthesia: LOCAL

## 2013-02-20 MED ORDER — MUPIROCIN 2 % EX OINT: TOPICAL_OINTMENT | Freq: Two times a day (BID) | CUTANEOUS | Status: DC

## 2013-02-20 MED ORDER — ACETAMINOPHEN 325 MG PO TABS: 325.0000 mg | ORAL_TABLET | ORAL | Status: DC | PRN

## 2013-02-20 MED ORDER — ONDANSETRON HCL 4 MG/2ML IJ SOLN: 4.0000 mg | Freq: Four times a day (QID) | INTRAMUSCULAR | Status: DC | PRN

## 2013-02-20 MED ORDER — CHLORHEXIDINE GLUCONATE 4 % EX LIQD
60.0000 mL | Freq: Once | CUTANEOUS | Status: DC
  Filled 2013-02-20: qty 60

## 2013-02-20 MED ORDER — SODIUM CHLORIDE 0.9 % IV SOLN: INTRAVENOUS | Status: DC

## 2013-02-20 MED ORDER — LIDOCAINE HCL (PF) 1 % IJ SOLN
INTRAMUSCULAR | Status: AC
  Filled 2013-02-20: qty 60

## 2013-02-20 MED ORDER — MUPIROCIN 2 % EX OINT
TOPICAL_OINTMENT | CUTANEOUS | Status: AC
  Filled 2013-02-20: qty 22

## 2013-02-20 MED ORDER — MIDAZOLAM HCL 5 MG/5ML IJ SOLN
INTRAMUSCULAR | Status: AC
  Filled 2013-02-20: qty 5

## 2013-02-20 MED ORDER — FENTANYL CITRATE 0.05 MG/ML IJ SOLN
INTRAMUSCULAR | Status: AC
  Filled 2013-02-20: qty 2

## 2013-02-20 NOTE — H&P (View-Only) (Signed)
HPI [name] returns today for followup. He is a pleasant 79 yo man with a h/o chronic systolic heart failure, ventricular tachycardia, paroxysmal atrial fibrillation, and coronary artery disease. The patient is status post ICD implantation and has reached elective replacement. He returns today for followup. He has had no recent ICD shocks and has tolerated sotalol very well. He has minimal peripheral edema. He does admit to some sodium indiscretion and has class II heart failure symptoms. No syncope. Allergies  Allergen Reactions  . Penicillins      Current Outpatient Prescriptions  Medication Sig Dispense Refill  . amLODipine (NORVASC) 5 MG tablet Take 1 tablet (5 mg total) by mouth 2 (two) times daily.  180 tablet  3  . aspirin 81 MG tablet Take 81 mg by mouth daily.        Marland Kitchen atorvastatin (LIPITOR) 20 MG tablet TAKE ONE TABLET BY MOUTH EVERY DAY  90 tablet  1  . carvedilol (COREG) 6.25 MG tablet Take 1 tablet (6.25 mg total) by mouth 2 (two) times daily.  180 tablet  3  . citalopram (CELEXA) 10 MG tablet Take 10 mg by mouth daily.      . furosemide (LASIX) 20 MG tablet (1/2) 40mg  tablet every other day  90 tablet  3  . lisinopril (PRINIVIL,ZESTRIL) 5 MG tablet 1 and 1/2 tablet daily (total 7.5mg )  135 tablet  3  . sotalol (BETAPACE) 120 MG tablet Take 1 tablet (120 mg total) by mouth 2 (two) times daily.  180 tablet  3  . spironolactone (ALDACTONE) 25 MG tablet Take 0.5 tablets (12.5 mg total) by mouth daily.  45 tablet  3  . warfarin (COUMADIN) 5 MG tablet TAKE AS DIRECTED  90 tablet  1   No current facility-administered medications for this visit.     Past Medical History  Diagnosis Date  . PVCs (premature ventricular contractions)   . Heart palpitations   . Coronary artery disease   . Hypertension   . Atrial fibrillation   . Anxiety     CHRONIC  . Myocardial infarction 1995    remote anterior   . Ventricular tachycardia   . Bladder cancer   . Hyperthyroidism   . Patent  foramen ovale   . Mural thrombus of heart   . Genitourinary disease     CANCER  . Hyperlipidemia   . Stroke   . Left ventricular dysfunction     ROS:   All systems reviewed and negative except as noted in the HPI.   Past Surgical History  Procedure Laterality Date  . Icd  2007  . Kidney surgery      REMOVED  . Bladder      PART OF IT REMOVED  . Cardiac catheterization    . Prostatectomy      TOTAL  . Eye surgery  2000    CATARACT BOTH EYES  . Hernia repair  1956     Family History  Problem Relation Age of Onset  . Heart attack Father      History   Social History  . Marital Status: Married    Spouse Name: N/A    Number of Children: N/A  . Years of Education: N/A   Occupational History  . Not on file.   Social History Main Topics  . Smoking status: Former Smoker    Quit date: 05/09/1990  . Smokeless tobacco: Not on file  . Alcohol Use: No  . Drug Use: No  . Sexual  Activity: No   Other Topics Concern  . Not on file   Social History Narrative  . No narrative on file     BP 158/81  Pulse 61  Ht 6' 1.5" (1.867 m)  Wt 240 lb (108.863 kg)  BMI 31.23 kg/m2  Physical Exam:  Well appearing 79 year old woman, NAD HEENT: Unremarkable Neck:  6 cm JVD, no thyromegally Back:  No CVA tenderness Lungs:  Clear with no wheezes, rales, or rhonchi. HEART:  Regular rate rhythm, no murmurs, no rubs, no clicks Abd:  soft, positive bowel sounds, no organomegally, no rebound, no guarding Ext:  2 plus pulses, no edema, no cyanosis, no clubbing Skin:  No rashes no nodules Neuro:  CN II through XII intact, motor grossly intact   DEVICE  Normal device function.  See PaceArt for details. His device is at elective replacement.  Assess/Plan:

## 2013-02-20 NOTE — Progress Notes (Signed)
Orthopedic Tech Progress Note Patient Details:  [name] [name] October 17, 1929 [id number]  Patient ID: Jayme Cloud, male   DOB: 1930-05-05, 79 y.o.   MRN: [id number]   Shawnie Pons 02/20/2013, 12:38 PMArm sling

## 2013-02-20 NOTE — CV Procedure (Deleted)
VVI ICD implanted via the left cephalic vein without immediate complication. Z#[id number].

## 2013-02-20 NOTE — CV Procedure (Signed)
EP Procedure Note  Procedure: ICD removal and insertion of a new device  Indication: Ischemic cardiomyopathy, EF 30%, chronic class 2 CHF and VT, s/p ICD with device at [facility]  Findings: after informed consent was obtained, the patient was taken to the diagnostic EP Lab in the fasting state. After the usual preparation and draping, IV fentanyl and versed were given. 30 cc of lidocaine was infiltrated into the left infraclavicular region. A 7 cm incision was carried out and electrocautery was used to dissect down to the facial plane. Electrocautery was utilized to dissect down to the ICD pocket. The generator was removed with gentle traction. The generator was disconnected from the previously implanted leads. The new St. Jude single chamber ICD was connected to the old lead and placed back in the subcutaneous pocket. The pocket was irrigated with antibiotic irrigation. The incision was closed with 2-0 and 3-0 Vicryl suture. Benzoin and Steri-Strips were placed on the skin. A pressure dressing was placed and the patient was returned to his room in satisfactory condition.  Complications: There were no immediate procedure complications  Results: This demonstrate successful removal of a previous implanted St. Jude single chamber defibrillator insertion of a new single-chamber defibrillator in a patient with an ischemic cardiomyopathy, chronic systolic heart failure, and ventricular tachycardia, status post prior device which had reached elective replacement.  Lewayne Bunting, M.D.

## 2013-02-20 NOTE — Interval H&P Note (Signed)
History and Physical Interval Note: Since prior clinic visit, no change in the history, physical exam, assessment and plan. 02/20/2013 9:39 AM  [name] [name]  has presented today for surgery, with the diagnosis of eri/vt  The various methods of treatment have been discussed with the patient and family. After consideration of risks, benefits and other options for treatment, the patient has consented to  Procedure(s): IMPLANTABLE CARDIOVERTER DEFIBRILLATOR GENERATOR CHANGE (N/A) as a surgical intervention .  The patient's history has been reviewed, patient examined, no change in status, stable for surgery.  I have reviewed the patient's chart and labs.  Questions were answered to the patient's satisfaction.     Lewayne Bunting

## 2013-03-01 ENCOUNTER — Ambulatory Visit (INDEPENDENT_AMBULATORY_CARE_PROVIDER_SITE_OTHER): Payer: Medicare Other | Admitting: *Deleted

## 2013-03-01 DIAGNOSIS — I4891 Unspecified atrial fibrillation: Secondary | ICD-10-CM

## 2013-03-01 DIAGNOSIS — Z7901 Long term (current) use of anticoagulants: Secondary | ICD-10-CM

## 2013-03-04 ENCOUNTER — Ambulatory Visit (INDEPENDENT_AMBULATORY_CARE_PROVIDER_SITE_OTHER): Payer: Medicare Other | Admitting: *Deleted

## 2013-03-04 DIAGNOSIS — I509 Heart failure, unspecified: Secondary | ICD-10-CM

## 2013-03-04 DIAGNOSIS — I5022 Chronic systolic (congestive) heart failure: Secondary | ICD-10-CM

## 2013-03-04 DIAGNOSIS — I472 Ventricular tachycardia: Secondary | ICD-10-CM

## 2013-03-04 DIAGNOSIS — Z95810 Presence of automatic (implantable) cardiac defibrillator: Secondary | ICD-10-CM

## 2013-03-04 LAB — ICD DEVICE OBSERVATION
BRDY-0002RV: 45 {beats}/min
DEVICE MODEL ICD: 7139864
FVT: 0
HV IMPEDENCE: 57 Ohm
PACEART VT: 0
RV LEAD AMPLITUDE: 11.8 mv
RV LEAD IMPEDENCE ICD: 525 Ohm
RV LEAD THRESHOLD: 0.75 V
TOT-0007: 0
TOT-0008: 0
TZAT-0001FASTVT: 1
TZAT-0001SLOWVT: 1
TZAT-0004FASTVT: 8
TZAT-0018FASTVT: NEGATIVE
TZAT-0019FASTVT: 7.5 V
TZAT-0019SLOWVT: 7.5 V
TZAT-0020SLOWVT: 1 ms
TZON-0003FASTVT: 315 ms
TZON-0004SLOWVT: 30
TZON-0005SLOWVT: 6
TZON-0010FASTVT: 40 ms
TZON-0010SLOWVT: 40 ms
TZST-0001FASTVT: 2
TZST-0001FASTVT: 3
TZST-0001FASTVT: 4
TZST-0001FASTVT: 5
TZST-0003FASTVT: 22.5 J
TZST-0003FASTVT: 36 J
VF: 0

## 2013-03-04 NOTE — Progress Notes (Signed)
Pt seen in device clinic for follow up of recently replaced ICD.  Wound well healed.  No redness, swelling, or edema.  Steri-strips removed prior to arrival.   Device interrogated and found to be functioning normally.  Changed VF NID from 12 to 24, VT-2 NID from 16 to 30. See PaceArt for full details.  Pt denies chest pain, shortness of breath, palpitations, or dizziness.  Pt to follow up with Dr. Ladona Ridgel 06/06/13.   Suriya Kovarik 03/04/2013 10:12 AM

## 2013-03-06 ENCOUNTER — Encounter: Payer: Self-pay | Admitting: Internal Medicine

## 2013-03-11 ENCOUNTER — Other Ambulatory Visit: Payer: Self-pay | Admitting: Cardiology

## 2013-04-12 ENCOUNTER — Ambulatory Visit (INDEPENDENT_AMBULATORY_CARE_PROVIDER_SITE_OTHER): Payer: Medicare Other | Admitting: *Deleted

## 2013-04-12 DIAGNOSIS — Z7901 Long term (current) use of anticoagulants: Secondary | ICD-10-CM

## 2013-04-12 DIAGNOSIS — I4891 Unspecified atrial fibrillation: Secondary | ICD-10-CM

## 2013-04-12 LAB — POCT INR: INR: 2.2

## 2013-04-27 ENCOUNTER — Other Ambulatory Visit: Payer: Self-pay | Admitting: Cardiology

## 2013-05-20 ENCOUNTER — Ambulatory Visit (INDEPENDENT_AMBULATORY_CARE_PROVIDER_SITE_OTHER): Payer: Medicare Other | Admitting: Pharmacist

## 2013-05-20 ENCOUNTER — Ambulatory Visit (INDEPENDENT_AMBULATORY_CARE_PROVIDER_SITE_OTHER): Payer: Medicare Other | Admitting: Cardiology

## 2013-05-20 ENCOUNTER — Encounter: Payer: Self-pay | Admitting: Cardiology

## 2013-05-20 VITALS — BP 140/78 | HR 59 | Ht 77.0 in | Wt 234.0 lb

## 2013-05-20 DIAGNOSIS — I509 Heart failure, unspecified: Secondary | ICD-10-CM

## 2013-05-20 DIAGNOSIS — R0602 Shortness of breath: Secondary | ICD-10-CM

## 2013-05-20 DIAGNOSIS — I5022 Chronic systolic (congestive) heart failure: Secondary | ICD-10-CM

## 2013-05-20 DIAGNOSIS — E785 Hyperlipidemia, unspecified: Secondary | ICD-10-CM

## 2013-05-20 DIAGNOSIS — I4891 Unspecified atrial fibrillation: Secondary | ICD-10-CM

## 2013-05-20 DIAGNOSIS — Z7901 Long term (current) use of anticoagulants: Secondary | ICD-10-CM

## 2013-05-20 DIAGNOSIS — I2510 Atherosclerotic heart disease of native coronary artery without angina pectoris: Secondary | ICD-10-CM

## 2013-05-20 LAB — POCT INR: INR: 2.4

## 2013-05-20 MED ORDER — LISINOPRIL 10 MG PO TABS: 10.0000 mg | ORAL_TABLET | Freq: Every day | ORAL | Status: DC

## 2013-05-20 NOTE — Progress Notes (Signed)
Patient ID: [name] [name], male   DOB: [birth year], 80 y.o.   MRN: [id number] PCP: Dr. Marisue Humble  80 yo with history of CAD, ischemic cardiomyopathy, and paroxysmal atrial fibrillation presents for cardiology followup.  He is in NSR today on sotalol.  No recent tachypalpitations.  Stable exertional dyspnea: short of breath with more than one flight of steps or when walking up a hill.  No problem on flat ground.  No chest pain. Weight is down 4 lbs.  He had ICD generator change in 10/14.    ECG: NSR, 1st degree AV block, old ASMI, LAFB, QTc interval normal   Labs (4/12): LDL 56, HDL 31 Labs (10/12): K 4.4, creatinine 1.2, TSH normal, HCT 48 Labs (11/12): K 4.3, creatinine 1.2 (creatinine clearance > 60), LFTs normal, LDL 53, HDL 31 Labs (3/13): K 4.8, creatinine 1.4, Mg 2.1 Labs (6/13): HDL 35, LDL 63 Labs (7/13): BNP 112, K 4.8, creatinine 1.4 Labs (10/13): K 5, creatinine 1.3, BNP 90 Labs (1/14): K 4.6, creatinine 1.3 Labs (3/14): LDL 55, HDL 27 Labs (5/14): K 4.8, creatinine 1.4 Labs (10/14): K 4.6, creatinine 1.5  PMH: 1.  CAD: Anterior MI 1995.  Myoview (6/09) with fixed anterior and apical defect, EF 36%.  2.  LV mural thrombus noted in 2006.  Patient had CVA related to this.  He is on chronic coumadin.  3.  Ischemic cardiomyopathy: Echo (10/12) with EF 30-35%, mild LV hypertrophy, apical septal and anterior akinesis, no LV thrombus seen, mild MR.  Patient has a [employer] officer, [employer] party ICD.  4.  H/o VT 5.  Prostate cancer s/p radiation 6.  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  S/p multiple surgeries for excision and right nephrectomy.  7.  PFO 8.  Atrial fibrillation: Paroxysmal.  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   9.  HTN 10. Hyperlipidemia 11. Diabetes mellitus  12. CKD  SH: Retired Biochemist, clinical for [employer].  Lives in [place].  Widower.  Has 3 sons.  Quit smoking in 1991.   FH: No premature CAD   ROS: All systems reviewed and negative  except as per HPI.    Current Outpatient Prescriptions  Medication Sig Dispense Refill  . amLODipine (NORVASC) 5 MG tablet TAKE ONE TABLET BY MOUTH TWICE DAILY  60 tablet  6  . aspirin EC 81 MG tablet Take 81 mg by mouth daily.      Marland Kitchen atorvastatin (LIPITOR) 20 MG tablet TAKE ONE TABLET BY MOUTH ONCE DAILY  90 tablet  0  . carvedilol (COREG) 6.25 MG tablet Take 6.25 mg by mouth 2 (two) times daily.      . citalopram (CELEXA) 10 MG tablet Take 10 mg by mouth at bedtime.       . diphenhydramine-acetaminophen (TYLENOL PM) 25-500 MG TABS Take 2 tablets by mouth at bedtime.      . furosemide (LASIX) 40 MG tablet Take 20 mg by mouth every other day.      . sodium chloride (OCEAN) 0.65 % nasal spray Place 1-2 sprays into the nose daily as needed for congestion.      . sotalol (BETAPACE) 120 MG tablet TAKE ONE TABLET BY MOUTH TWICE DAILY  180 tablet  0  . spironolactone (ALDACTONE) 25 MG tablet Take 12.5 mg by mouth daily.      Marland Kitchen warfarin (COUMADIN) 5 MG tablet Take 2.5-5 mg by mouth daily. Takes 2.5mg  daily except 5mg  on mondays, wednesdays, and fridays      .  lisinopril (PRINIVIL,ZESTRIL) 10 MG tablet Take 1 tablet (10 mg total) by mouth daily.  90 tablet  3   No current facility-administered medications for this visit.    BP 140/78  Pulse 59  Ht 6\' 5"  (1.956 m)  Wt 106.142 kg (234 lb)  BMI 27.74 kg/m2 General: NAD Neck: No JVD, no thyromegaly or thyroid nodule.  Lungs: Clear to auscultation bilaterally with normal respiratory effort. CV: Nondisplaced PMI.  Heart regular S1/S2, no S3/S4, 1/6 SEM.  No edema.  No carotid bruit.  Normal pedal pulses.  Abdomen: Soft, nontender, no hepatosplenomegaly, no distention.  Neurologic: Alert and oriented x 3.  Psych: Normal affect. Extremities: No clubbing or cyanosis.   Assessment/Plan:  Atrial fibrillation Paroxysmal. He is in NSR today with no recent symptoms concerning for atrial fibrillation. QT interval is normal on sotalol.  Continue  warfarin. Would use this rather than NOAC agent given h/o LV thrombus.   CAD Stable with no chest pain. Continue ASA 81, lisinopril, Coreg, and statin. Hyperlipidemia  Continue atorvastatin.  Check lipids in 1 week with BMET.  Systolic CHF, chronic  NYHA class II symptoms without significant volume overload. Continue Coreg and spironolactone.  Will increase lisinopril to 10 mg daily with BMET/BNP in 1 week.  Has ICD. I am not going to make any changes to his Lasix dosing.   CKD Check BMET after lisinopril increase.  Followup in 4 months.   [name] 05/20/2013

## 2013-05-20 NOTE — Patient Instructions (Signed)
Increase lisinopril to 10mg  daily.   Your physician recommends that you return for a FASTING lipid profile /BMET/BNP in 1 week.   Your physician wants you to follow-up in: 4 months with Dr Aundra Dubin. (May 2015).  You will receive a reminder letter in the mail two months in advance. If you don't receive a letter, please call our office to schedule the follow-up appointment.

## 2013-05-27 ENCOUNTER — Other Ambulatory Visit (INDEPENDENT_AMBULATORY_CARE_PROVIDER_SITE_OTHER): Payer: Medicare Other

## 2013-05-27 DIAGNOSIS — I2510 Atherosclerotic heart disease of native coronary artery without angina pectoris: Secondary | ICD-10-CM

## 2013-05-27 DIAGNOSIS — R0602 Shortness of breath: Secondary | ICD-10-CM

## 2013-05-27 DIAGNOSIS — I4891 Unspecified atrial fibrillation: Secondary | ICD-10-CM

## 2013-05-27 DIAGNOSIS — I5022 Chronic systolic (congestive) heart failure: Secondary | ICD-10-CM

## 2013-05-27 LAB — BASIC METABOLIC PANEL
BUN: 24 mg/dL — AB (ref 6–23)
CHLORIDE: 102 meq/L (ref 96–112)
CO2: 28 meq/L (ref 19–32)
CREATININE: 1.5 mg/dL (ref 0.4–1.5)
Calcium: 9.2 mg/dL (ref 8.4–10.5)
GFR: 48.02 mL/min — ABNORMAL LOW (ref >60.00)
Glucose, Bld: 209 mg/dL — ABNORMAL HIGH (ref 70–99)
POTASSIUM: 4.8 meq/L (ref 3.5–5.1)
Sodium: 139 mEq/L (ref 135–145)

## 2013-05-27 LAB — BRAIN NATRIURETIC PEPTIDE: Pro B Natriuretic peptide (BNP): 68 pg/mL (ref 0.0–100.0)

## 2013-05-27 LAB — LIPID PANEL
CHOL/HDL RATIO: 4
Cholesterol: 122 mg/dL (ref 0–200)
HDL: 28.7 mg/dL — AB (ref >39.00)
Triglycerides: 209 mg/dL — ABNORMAL HIGH (ref 0.0–149.0)
VLDL: 41.8 mg/dL — AB (ref 0.0–40.0)

## 2013-05-27 LAB — LDL CHOLESTEROL, DIRECT: Direct LDL: 68.9 mg/dL

## 2013-06-06 ENCOUNTER — Ambulatory Visit (INDEPENDENT_AMBULATORY_CARE_PROVIDER_SITE_OTHER): Payer: Medicare Other | Admitting: Internal Medicine

## 2013-06-06 ENCOUNTER — Encounter: Payer: Self-pay | Admitting: Internal Medicine

## 2013-06-06 VITALS — BP 128/74 | HR 66 | Ht 72.5 in | Wt 234.6 lb

## 2013-06-06 DIAGNOSIS — I4729 Other ventricular tachycardia: Secondary | ICD-10-CM

## 2013-06-06 DIAGNOSIS — I472 Ventricular tachycardia, unspecified: Secondary | ICD-10-CM

## 2013-06-06 DIAGNOSIS — Z95810 Presence of automatic (implantable) cardiac defibrillator: Secondary | ICD-10-CM

## 2013-06-06 DIAGNOSIS — I2510 Atherosclerotic heart disease of native coronary artery without angina pectoris: Secondary | ICD-10-CM

## 2013-06-06 LAB — MDC_IDC_ENUM_SESS_TYPE_INCLINIC
HIGH POWER IMPEDANCE MEASURED VALUE: 50.7528
Lead Channel Impedance Value: 562.5 Ohm
Lead Channel Pacing Threshold Amplitude: 0.75 V
Lead Channel Pacing Threshold Pulse Width: 0.4 ms
Lead Channel Setting Pacing Pulse Width: 0.4 ms
Lead Channel Setting Sensing Sensitivity: 0.5 mV
MDC IDC MSMT BATTERY REMAINING LONGEVITY: 108 mo
MDC IDC MSMT LEADCHNL RV SENSING INTR AMPL: 11.8 mV
MDC IDC PG SERIAL: 7139864
MDC IDC SESS DTM: 20150129092711
MDC IDC SET LEADCHNL RV PACING AMPLITUDE: 2.5 V
MDC IDC SET ZONE DETECTION INTERVAL: 250 ms
MDC IDC STAT BRADY RV PERCENT PACED: 0.26 %
Zone Setting Detection Interval: 315 ms
Zone Setting Detection Interval: 460 ms

## 2013-06-06 NOTE — Patient Instructions (Signed)
Your physician recommends that you schedule a follow-up appointment in: 3 months with device clinic and in Oct 2015 Dr Lovena Le

## 2013-06-06 NOTE — Assessment & Plan Note (Signed)
Interrogation of his ICD demonstrates normal function. We'll plan to recheck in several months.

## 2013-06-06 NOTE — Assessment & Plan Note (Signed)
He denies anginal symptoms. He'll continue his current medical therapy, and I've encouraged the patient to increase his physical activity.

## 2013-06-06 NOTE — Assessment & Plan Note (Signed)
His ventricular arrhythmias have been quiet on sotalol. No change in medications.

## 2013-06-06 NOTE — Progress Notes (Signed)
HPI [name] returns today for followup. He is a pleasant 80 yo man with a h/o chronic systolic heart failure, ventricular tachycardia, paroxysmal atrial fibrillation, and coronary artery disease. The patient is status post ICD generator replacement.  He returns today for followup. He has had no recent ICD shocks and has tolerated sotalol very well. He has minimal peripheral edema. He does admit to some sodium indiscretion and has class II heart failure symptoms. No syncope. Allergies  Allergen Reactions  . Penicillins Swelling  . Tape     Itch/break out/redness     Current Outpatient Prescriptions  Medication Sig Dispense Refill  . amLODipine (NORVASC) 5 MG tablet TAKE ONE TABLET BY MOUTH TWICE DAILY  60 tablet  6  . aspirin EC 81 MG tablet Take 81 mg by mouth daily.      Marland Kitchen atorvastatin (LIPITOR) 20 MG tablet TAKE ONE TABLET BY MOUTH ONCE DAILY  90 tablet  0  . carvedilol (COREG) 6.25 MG tablet Take 6.25 mg by mouth 2 (two) times daily.      . citalopram (CELEXA) 10 MG tablet Take 10 mg by mouth at bedtime.       . diphenhydramine-acetaminophen (TYLENOL PM) 25-500 MG TABS Take 2 tablets by mouth at bedtime.      . furosemide (LASIX) 40 MG tablet Take 20 mg by mouth every other day.      . lisinopril (PRINIVIL,ZESTRIL) 10 MG tablet Take 1 tablet (10 mg total) by mouth daily.  90 tablet  3  . sodium chloride (OCEAN) 0.65 % nasal spray Place 1-2 sprays into the nose daily as needed for congestion.      . sotalol (BETAPACE) 120 MG tablet TAKE ONE TABLET BY MOUTH TWICE DAILY  180 tablet  0  . spironolactone (ALDACTONE) 25 MG tablet Take 12.5 mg by mouth daily.      Marland Kitchen warfarin (COUMADIN) 5 MG tablet Take 2.5-5 mg by mouth daily. Takes 2.5mg  daily except 5mg  on mondays, wednesdays, and fridays       No current facility-administered medications for this visit.     Past Medical History  Diagnosis Date  . PVCs (premature ventricular contractions)   . Heart palpitations   . Coronary artery  disease   . Hypertension   . Atrial fibrillation   . Anxiety     CHRONIC  . Myocardial infarction 1995    remote anterior   . Ventricular tachycardia   . Bladder cancer   . Hyperthyroidism   . Patent foramen ovale   . Mural thrombus of heart   . Genitourinary disease     CANCER  . Hyperlipidemia   . Stroke   . Left ventricular dysfunction     ROS:   All systems reviewed and negative except as noted in the HPI.   Past Surgical History  Procedure Laterality Date  . Icd  2007  . Kidney surgery      REMOVED  . Bladder      PART OF IT REMOVED  . Cardiac catheterization    . Prostatectomy      TOTAL  . Eye surgery  2000    CATARACT BOTH EYES  . Hernia repair  1956     Family History  Problem Relation Age of Onset  . Heart attack Father      History   Social History  . Marital Status: Married    Spouse Name: N/A    Number of Children: N/A  . Years of Education: N/A  Occupational History  . Not on file.   Social History Main Topics  . Smoking status: Former Smoker    Quit date: 05/09/1990  . Smokeless tobacco: Not on file  . Alcohol Use: No  . Drug Use: No  . Sexual Activity: No   Other Topics Concern  . Not on file   Social History Narrative  . No narrative on file     BP 128/74  Pulse 66  Ht 6' 0.5" (1.842 m)  Wt 234 lb 9.6 oz (106.414 kg)  BMI 31.36 kg/m2  Physical Exam:  Well appearing 80 year old woman, NAD HEENT: Unremarkable Neck:  6 cm JVD, no thyromegally Back:  No CVA tenderness Lungs:  Clear with no wheezes, rales, or rhonchi. HEART:  Regular rate rhythm, no murmurs, no rubs, no clicks Abd:  soft, positive bowel sounds, no organomegally, no rebound, no guarding Ext:  2 plus pulses, no edema, no cyanosis, no clubbing Skin:  No rashes no nodules Neuro:  CN II through XII intact, motor grossly intact   DEVICE  Normal device function.  See PaceArt for details.   Assess/Plan:

## 2013-06-17 ENCOUNTER — Other Ambulatory Visit: Payer: Self-pay | Admitting: Cardiology

## 2013-06-27 ENCOUNTER — Encounter: Payer: Self-pay | Admitting: Internal Medicine

## 2013-07-01 ENCOUNTER — Ambulatory Visit (INDEPENDENT_AMBULATORY_CARE_PROVIDER_SITE_OTHER): Payer: Medicare Other | Admitting: *Deleted

## 2013-07-01 DIAGNOSIS — Z7901 Long term (current) use of anticoagulants: Secondary | ICD-10-CM

## 2013-07-01 DIAGNOSIS — Z5181 Encounter for therapeutic drug level monitoring: Secondary | ICD-10-CM

## 2013-07-01 DIAGNOSIS — I4891 Unspecified atrial fibrillation: Secondary | ICD-10-CM

## 2013-07-01 LAB — POCT INR: INR: 2.5

## 2013-07-31 ENCOUNTER — Other Ambulatory Visit: Payer: Self-pay | Admitting: Cardiology

## 2013-08-07 ENCOUNTER — Other Ambulatory Visit: Payer: Self-pay | Admitting: Cardiology

## 2013-08-12 ENCOUNTER — Ambulatory Visit (INDEPENDENT_AMBULATORY_CARE_PROVIDER_SITE_OTHER): Payer: Medicare Other

## 2013-08-12 ENCOUNTER — Other Ambulatory Visit: Payer: Self-pay | Admitting: Cardiology

## 2013-08-12 DIAGNOSIS — Z7901 Long term (current) use of anticoagulants: Secondary | ICD-10-CM

## 2013-08-12 DIAGNOSIS — Z5181 Encounter for therapeutic drug level monitoring: Secondary | ICD-10-CM

## 2013-08-12 DIAGNOSIS — I4891 Unspecified atrial fibrillation: Secondary | ICD-10-CM

## 2013-08-12 LAB — POCT INR: INR: 2

## 2013-08-28 ENCOUNTER — Ambulatory Visit (INDEPENDENT_AMBULATORY_CARE_PROVIDER_SITE_OTHER): Payer: Medicare Other | Admitting: *Deleted

## 2013-08-28 DIAGNOSIS — I472 Ventricular tachycardia: Secondary | ICD-10-CM

## 2013-08-28 DIAGNOSIS — Z95810 Presence of automatic (implantable) cardiac defibrillator: Secondary | ICD-10-CM

## 2013-08-28 DIAGNOSIS — I4729 Other ventricular tachycardia: Secondary | ICD-10-CM

## 2013-08-28 DIAGNOSIS — I509 Heart failure, unspecified: Secondary | ICD-10-CM

## 2013-08-28 DIAGNOSIS — I5022 Chronic systolic (congestive) heart failure: Secondary | ICD-10-CM

## 2013-08-28 LAB — MDC_IDC_ENUM_SESS_TYPE_INCLINIC
Brady Statistic RV Percent Paced: 0.24 %
Date Time Interrogation Session: 20150422112155
HighPow Impedance: 54.251
Implantable Pulse Generator Serial Number: 7139864
Lead Channel Pacing Threshold Amplitude: 0.75 V
Lead Channel Pacing Threshold Amplitude: 0.75 V
Lead Channel Pacing Threshold Pulse Width: 0.4 ms
Lead Channel Sensing Intrinsic Amplitude: 11.8 mV
MDC IDC MSMT BATTERY REMAINING LONGEVITY: 105.6 mo
MDC IDC MSMT LEADCHNL RV IMPEDANCE VALUE: 512.5 Ohm
MDC IDC MSMT LEADCHNL RV PACING THRESHOLD PULSEWIDTH: 0.4 ms
MDC IDC SET LEADCHNL RV PACING AMPLITUDE: 2.5 V
MDC IDC SET LEADCHNL RV PACING PULSEWIDTH: 0.4 ms
MDC IDC SET LEADCHNL RV SENSING SENSITIVITY: 0.5 mV
Zone Setting Detection Interval: 250 ms
Zone Setting Detection Interval: 315 ms
Zone Setting Detection Interval: 460 ms

## 2013-08-28 NOTE — Progress Notes (Signed)
ICD check in clinic. Normal device function. Thresholds and sensing consistent with previous device measurements. Impedance trends stable over time. 2 NSVT---both 13 beats, fastest CL 363ms. Histogram distribution appropriate for patient and level of activity. Turned on CorVue. Device programmed at appropriate safety margins. Device programmed to optimize intrinsic conduction. Estimated longevity 8.4-8.18yrs. Alert vibration demonstrated for patient, pt knows to call clinic if felt. ROV w/ device clinic 11/27/13 @ 9:30.

## 2013-09-12 ENCOUNTER — Encounter: Payer: Self-pay | Admitting: Internal Medicine

## 2013-09-16 ENCOUNTER — Other Ambulatory Visit: Payer: Self-pay | Admitting: Cardiology

## 2013-09-23 ENCOUNTER — Ambulatory Visit (INDEPENDENT_AMBULATORY_CARE_PROVIDER_SITE_OTHER): Payer: Medicare Other | Admitting: Surgery

## 2013-09-23 DIAGNOSIS — I4891 Unspecified atrial fibrillation: Secondary | ICD-10-CM

## 2013-09-23 DIAGNOSIS — Z7901 Long term (current) use of anticoagulants: Secondary | ICD-10-CM

## 2013-09-23 DIAGNOSIS — Z5181 Encounter for therapeutic drug level monitoring: Secondary | ICD-10-CM

## 2013-09-23 LAB — POCT INR: INR: 2.5

## 2013-10-07 ENCOUNTER — Other Ambulatory Visit: Payer: Self-pay | Admitting: Cardiology

## 2013-10-29 ENCOUNTER — Other Ambulatory Visit: Payer: Self-pay | Admitting: Cardiology

## 2013-11-01 ENCOUNTER — Other Ambulatory Visit: Payer: Self-pay | Admitting: Cardiology

## 2013-11-04 ENCOUNTER — Ambulatory Visit (INDEPENDENT_AMBULATORY_CARE_PROVIDER_SITE_OTHER): Payer: Medicare Other | Admitting: *Deleted

## 2013-11-04 DIAGNOSIS — I4891 Unspecified atrial fibrillation: Secondary | ICD-10-CM

## 2013-11-04 DIAGNOSIS — Z5181 Encounter for therapeutic drug level monitoring: Secondary | ICD-10-CM

## 2013-11-04 DIAGNOSIS — Z7901 Long term (current) use of anticoagulants: Secondary | ICD-10-CM

## 2013-11-04 LAB — POCT INR: INR: 2.4

## 2013-11-26 ENCOUNTER — Other Ambulatory Visit: Payer: Self-pay | Admitting: Cardiology

## 2013-11-27 ENCOUNTER — Ambulatory Visit (INDEPENDENT_AMBULATORY_CARE_PROVIDER_SITE_OTHER): Payer: Medicare Other | Admitting: *Deleted

## 2013-11-27 DIAGNOSIS — I472 Ventricular tachycardia, unspecified: Secondary | ICD-10-CM

## 2013-11-27 DIAGNOSIS — I509 Heart failure, unspecified: Secondary | ICD-10-CM

## 2013-11-27 DIAGNOSIS — I4729 Other ventricular tachycardia: Secondary | ICD-10-CM

## 2013-11-27 DIAGNOSIS — I5022 Chronic systolic (congestive) heart failure: Secondary | ICD-10-CM

## 2013-11-27 LAB — MDC_IDC_ENUM_SESS_TYPE_INCLINIC
Brady Statistic RV Percent Paced: 0.14 %
HighPow Impedance: 56.5934
Implantable Pulse Generator Serial Number: 7139864
Lead Channel Impedance Value: 525 Ohm
Lead Channel Pacing Threshold Amplitude: 0.5 V
Lead Channel Pacing Threshold Amplitude: 0.5 V
Lead Channel Pacing Threshold Pulse Width: 0.4 ms
Lead Channel Sensing Intrinsic Amplitude: 11.8 mV
Lead Channel Setting Pacing Amplitude: 2.5 V
Lead Channel Setting Pacing Pulse Width: 0.4 ms
Lead Channel Setting Sensing Sensitivity: 0.5 mV
MDC IDC MSMT BATTERY REMAINING LONGEVITY: 104.4 mo
MDC IDC MSMT LEADCHNL RV PACING THRESHOLD PULSEWIDTH: 0.4 ms
MDC IDC SESS DTM: 20150722094800
MDC IDC SET ZONE DETECTION INTERVAL: 315 ms
MDC IDC SET ZONE DETECTION INTERVAL: 460 ms
Zone Setting Detection Interval: 250 ms

## 2013-11-27 NOTE — Progress Notes (Signed)
ICD check in clinic. Normal device function. Thresholds and sensing consistent with previous device measurements. Impedance trends stable over time. No evidence of any ventricular arrhythmias. CorVue normal.   Histogram distribution appropriate for patient and level of activity. No changes made this session. Device programmed at appropriate safety margins. Device programmed to optimize intrinsic conduction. Estimated longevity 8.3 years. Pt enrolled in remote follow-up.  Patient education completed including shock plan. Alert tones/vibration demonstrated for patient.  ROV in October with Dr. Lovena Le.

## 2013-12-09 ENCOUNTER — Other Ambulatory Visit: Payer: Self-pay | Admitting: Cardiology

## 2013-12-13 ENCOUNTER — Encounter: Payer: Self-pay | Admitting: Cardiology

## 2013-12-13 ENCOUNTER — Ambulatory Visit (INDEPENDENT_AMBULATORY_CARE_PROVIDER_SITE_OTHER): Payer: Medicare Other | Admitting: Cardiology

## 2013-12-13 ENCOUNTER — Ambulatory Visit (INDEPENDENT_AMBULATORY_CARE_PROVIDER_SITE_OTHER): Payer: Medicare Other | Admitting: Pharmacist

## 2013-12-13 VITALS — BP 140/68 | HR 67 | Ht 72.5 in | Wt 236.0 lb

## 2013-12-13 DIAGNOSIS — R0602 Shortness of breath: Secondary | ICD-10-CM

## 2013-12-13 DIAGNOSIS — I4729 Other ventricular tachycardia: Secondary | ICD-10-CM

## 2013-12-13 DIAGNOSIS — I509 Heart failure, unspecified: Secondary | ICD-10-CM

## 2013-12-13 DIAGNOSIS — I4819 Other persistent atrial fibrillation: Secondary | ICD-10-CM

## 2013-12-13 DIAGNOSIS — Z79899 Other long term (current) drug therapy: Secondary | ICD-10-CM

## 2013-12-13 DIAGNOSIS — I4891 Unspecified atrial fibrillation: Secondary | ICD-10-CM

## 2013-12-13 DIAGNOSIS — Z7901 Long term (current) use of anticoagulants: Secondary | ICD-10-CM

## 2013-12-13 DIAGNOSIS — I2510 Atherosclerotic heart disease of native coronary artery without angina pectoris: Secondary | ICD-10-CM

## 2013-12-13 DIAGNOSIS — I5022 Chronic systolic (congestive) heart failure: Secondary | ICD-10-CM

## 2013-12-13 DIAGNOSIS — Z5181 Encounter for therapeutic drug level monitoring: Secondary | ICD-10-CM

## 2013-12-13 DIAGNOSIS — I10 Essential (primary) hypertension: Secondary | ICD-10-CM

## 2013-12-13 DIAGNOSIS — I472 Ventricular tachycardia: Secondary | ICD-10-CM

## 2013-12-13 LAB — POCT INR: INR: 2.6

## 2013-12-13 NOTE — Patient Instructions (Signed)
Please increase your Furosemide to 20 mg a day. Continue all other medications as listed.  Please return in 1 week for blood work (BMP,BNP)  Follow up in 4 months with Dr Aundra Dubin.

## 2013-12-15 NOTE — Progress Notes (Signed)
Patient ID: [name] [name], male   DOB: 12-16-1929, 80 y.o.   MRN: [id number] PCP: Dr. Marisue Humble  80 yo with history of CAD, ischemic cardiomyopathy, and paroxysmal atrial fibrillation presents for cardiology followup.  He is in NSR today on sotalol.  No recent tachypalpitations.  Stable exertional dyspnea: short of breath with more than one flight of steps or when walking up a hill.  No problem on flat ground.  No chest pain. No orthopnea or PND, +bendopnea.  Weight is up 2 lbs.    ECG: NSR, 1st degree AV block, old ASMI and lateral MI, LAFB, QTc interval normal   Labs (4/12): LDL 56, HDL 31 Labs (10/12): K 4.4, creatinine 1.2, TSH normal, HCT 48 Labs (11/12): K 4.3, creatinine 1.2 (creatinine clearance > 60), LFTs normal, LDL 53, HDL 31 Labs (3/13): K 4.8, creatinine 1.4, Mg 2.1 Labs (6/13): HDL 35, LDL 63 Labs (7/13): BNP 112, K 4.8, creatinine 1.4 Labs (10/13): K 5, creatinine 1.3, BNP 90 Labs (1/14): K 4.6, creatinine 1.3 Labs (3/14): LDL 55, HDL 27 Labs (5/14): K 4.8, creatinine 1.4 Labs (10/14): K 4.6, creatinine 1.5 Labs (1/15): K 4.8, creatinine 1.5, BNP 68, HDL 29, LDL 69  PMH: 1.  CAD: Anterior MI 1995.  Myoview (6/09) with fixed anterior and apical defect, EF 36%.  2.  LV mural thrombus noted in 2006.  Patient had CVA related to this.  He is on chronic coumadin.  3.  Ischemic cardiomyopathy: Echo (10/12) with EF 30-35%, mild LV hypertrophy, apical septal and anterior akinesis, no LV thrombus seen, mild MR.  Patient has a Research officer, political party ICD.  4.  H/o VT 5.  Prostate cancer s/p radiation 6.  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  S/p multiple surgeries for excision and right nephrectomy.  7.  PFO 8.  Atrial fibrillation: Paroxysmal.  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   9.  HTN 10. Hyperlipidemia 11. Diabetes mellitus  12. 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  Has 3 sons.  Quit smoking in 1991.    FH: No premature CAD   ROS: All systems reviewed and negative except as per HPI.    Current Outpatient Prescriptions  Medication Sig Dispense Refill  . amLODipine (NORVASC) 5 MG tablet TAKE ONE TABLET BY MOUTH TWICE DAILY  60 tablet  0  . aspirin EC 81 MG tablet Take 81 mg by mouth daily.      Marland Kitchen atorvastatin (LIPITOR) 20 MG tablet TAKE ONE TABLET BY MOUTH ONCE DAILY  90 tablet  0  . carvedilol (COREG) 6.25 MG tablet TAKE ONE TABLET BY MOUTH TWICE DAILY  60 tablet  0  . citalopram (CELEXA) 10 MG tablet Take 10 mg by mouth at bedtime.       . diphenhydramine-acetaminophen (TYLENOL PM) 25-500 MG TABS Take 2 tablets by mouth at bedtime.      . furosemide (LASIX) 40 MG tablet Take 0.5 tablets (20 mg total) by mouth daily.  45 tablet  3  . lisinopril (PRINIVIL,ZESTRIL) 10 MG tablet Take 1 tablet (10 mg total) by mouth daily.  90 tablet  3  . sodium chloride (OCEAN) 0.65 % nasal spray Place 1-2 sprays into the nose daily as needed for congestion.      . sotalol (BETAPACE) 120 MG tablet TAKE ONE TABLET BY MOUTH TWICE DAILY  180 tablet  0  . spironolactone (ALDACTONE) 25 MG tablet TAKE ONE-HALF  TABLET BY MOUTH ONCE DAILY  45 tablet  3  . warfarin (COUMADIN) 5 MG tablet 1/2 tablet every day except 1 tablet on Monday, Wednesday and Friday or as directed by coumadin clinic  90 tablet  1   No current facility-administered medications for this visit.    BP 140/68  Pulse 67  Ht 6' 0.5" (1.842 m)  Wt 236 lb (107.049 kg)  BMI 31.55 kg/m2 General: NAD Neck: JVP 7-8 cm, no thyromegaly or thyroid nodule.  Lungs: Clear to auscultation bilaterally with normal respiratory effort. CV: Nondisplaced PMI.  Heart regular S1/S2, no S3/S4, 1/6 SEM.  1+ ankle edema.  No carotid bruit.  Normal pedal pulses.  Abdomen: Soft, nontender, no hepatosplenomegaly, no distention.  Neurologic: Alert and oriented x 3.  Psych: Normal affect. Extremities: No clubbing or cyanosis.   Assessment/Plan:  Atrial  fibrillation Paroxysmal. He is in NSR today with no recent symptoms concerning for atrial fibrillation. QT interval is normal on sotalol.  Continue warfarin. Would use this rather than NOAC agent given h/o LV thrombus.   CAD Stable with no chest pain. Continue ASA 81, lisinopril, Coreg, and statin. Hyperlipidemia  Continue atorvastatin.  Good lipids in 1/15. Systolic CHF, chronic  NYHA class II symptoms with some volume overload on exam. Continue Coreg, lisinopril, and spironolactone.  With elevated creatinine and high normal K will not change ACEI or spironolactone dosing.  Has ICD. With mild volume overload, I am going to increase Lasix to 20 mg daily.  I will have him get BMET/BNP in 1 week.  CKD Check BMET after Lasix increase.  Followup in 4 months.   [name] 12/15/2013

## 2013-12-20 ENCOUNTER — Other Ambulatory Visit (INDEPENDENT_AMBULATORY_CARE_PROVIDER_SITE_OTHER): Payer: Medicare Other

## 2013-12-20 DIAGNOSIS — I10 Essential (primary) hypertension: Secondary | ICD-10-CM

## 2013-12-20 DIAGNOSIS — R0602 Shortness of breath: Secondary | ICD-10-CM

## 2013-12-20 DIAGNOSIS — Z79899 Other long term (current) drug therapy: Secondary | ICD-10-CM

## 2013-12-20 LAB — BASIC METABOLIC PANEL
BUN: 22 mg/dL (ref 6–23)
CO2: 29 meq/L (ref 19–32)
Calcium: 9.6 mg/dL (ref 8.4–10.5)
Chloride: 99 mEq/L (ref 96–112)
Creatinine, Ser: 1.5 mg/dL (ref 0.4–1.5)
GFR: 48.71 mL/min — AB (ref >60.00)
GLUCOSE: 241 mg/dL — AB (ref 70–99)
POTASSIUM: 4.6 meq/L (ref 3.5–5.1)
Sodium: 134 mEq/L — ABNORMAL LOW (ref 135–145)

## 2013-12-20 LAB — BRAIN NATRIURETIC PEPTIDE: Pro B Natriuretic peptide (BNP): 87 pg/mL (ref 0.0–100.0)

## 2013-12-31 ENCOUNTER — Other Ambulatory Visit: Payer: Self-pay | Admitting: Cardiology

## 2014-01-01 ENCOUNTER — Encounter: Payer: Self-pay | Admitting: Internal Medicine

## 2014-01-16 ENCOUNTER — Other Ambulatory Visit: Payer: Self-pay | Admitting: Cardiology

## 2014-01-24 ENCOUNTER — Ambulatory Visit (INDEPENDENT_AMBULATORY_CARE_PROVIDER_SITE_OTHER): Payer: Medicare Other | Admitting: *Deleted

## 2014-01-24 DIAGNOSIS — Z7901 Long term (current) use of anticoagulants: Secondary | ICD-10-CM

## 2014-01-24 DIAGNOSIS — I4891 Unspecified atrial fibrillation: Secondary | ICD-10-CM

## 2014-01-24 DIAGNOSIS — Z5181 Encounter for therapeutic drug level monitoring: Secondary | ICD-10-CM

## 2014-01-24 LAB — POCT INR: INR: 2.5

## 2014-01-28 ENCOUNTER — Other Ambulatory Visit: Payer: Self-pay | Admitting: *Deleted

## 2014-01-28 MED ORDER — CARVEDILOL 6.25 MG PO TABS: ORAL_TABLET | ORAL | Status: DC

## 2014-01-28 MED ORDER — SOTALOL HCL 120 MG PO TABS: ORAL_TABLET | ORAL | Status: DC

## 2014-01-28 MED ORDER — WARFARIN SODIUM 5 MG PO TABS: ORAL_TABLET | ORAL | Status: DC

## 2014-01-28 MED ORDER — AMLODIPINE BESYLATE 5 MG PO TABS: ORAL_TABLET | ORAL | Status: DC

## 2014-02-10 ENCOUNTER — Other Ambulatory Visit: Payer: Self-pay | Admitting: Cardiology

## 2014-02-27 ENCOUNTER — Ambulatory Visit (INDEPENDENT_AMBULATORY_CARE_PROVIDER_SITE_OTHER): Payer: Medicare Other | Admitting: Internal Medicine

## 2014-02-27 ENCOUNTER — Encounter: Payer: Self-pay | Admitting: Internal Medicine

## 2014-02-27 ENCOUNTER — Ambulatory Visit (INDEPENDENT_AMBULATORY_CARE_PROVIDER_SITE_OTHER): Payer: Medicare Other | Admitting: *Deleted

## 2014-02-27 VITALS — BP 112/62 | HR 73 | Ht 73.0 in | Wt 233.1 lb

## 2014-02-27 DIAGNOSIS — I4891 Unspecified atrial fibrillation: Secondary | ICD-10-CM

## 2014-02-27 DIAGNOSIS — Z95810 Presence of automatic (implantable) cardiac defibrillator: Secondary | ICD-10-CM

## 2014-02-27 DIAGNOSIS — I4729 Other ventricular tachycardia: Secondary | ICD-10-CM

## 2014-02-27 DIAGNOSIS — I5022 Chronic systolic (congestive) heart failure: Secondary | ICD-10-CM

## 2014-02-27 DIAGNOSIS — I2510 Atherosclerotic heart disease of native coronary artery without angina pectoris: Secondary | ICD-10-CM

## 2014-02-27 DIAGNOSIS — I472 Ventricular tachycardia: Secondary | ICD-10-CM

## 2014-02-27 DIAGNOSIS — Z7901 Long term (current) use of anticoagulants: Secondary | ICD-10-CM

## 2014-02-27 DIAGNOSIS — Z5181 Encounter for therapeutic drug level monitoring: Secondary | ICD-10-CM

## 2014-02-27 LAB — MDC_IDC_ENUM_SESS_TYPE_INCLINIC
Battery Remaining Longevity: 102 mo
Brady Statistic RV Percent Paced: 0.2 %
HighPow Impedance: 62 Ohm
Lead Channel Pacing Threshold Amplitude: 0.5 V
Lead Channel Pacing Threshold Pulse Width: 0.4 ms
Lead Channel Pacing Threshold Pulse Width: 0.4 ms
Lead Channel Setting Pacing Amplitude: 2.5 V
Lead Channel Setting Pacing Pulse Width: 0.4 ms
Lead Channel Setting Sensing Sensitivity: 0.5 mV
MDC IDC MSMT LEADCHNL RV IMPEDANCE VALUE: 587.5 Ohm
MDC IDC MSMT LEADCHNL RV PACING THRESHOLD AMPLITUDE: 0.5 V
MDC IDC MSMT LEADCHNL RV SENSING INTR AMPL: 11.8 mV
MDC IDC PG SERIAL: 7139864
MDC IDC SESS DTM: 20151022084547
MDC IDC SET ZONE DETECTION INTERVAL: 460 ms
Zone Setting Detection Interval: 250 ms
Zone Setting Detection Interval: 315 ms

## 2014-02-27 LAB — POCT INR: INR: 2.7

## 2014-02-27 NOTE — Patient Instructions (Signed)
Your physician recommends that you schedule a follow-up appointment in: 3 months in the device clinic and 12 months with Dr Taylor  

## 2014-02-27 NOTE — Assessment & Plan Note (Signed)
His VT is stable on Sotalol. No change in meds.

## 2014-02-27 NOTE — Assessment & Plan Note (Signed)
His [employer] ICD is working normally. Will recheck in several months.  

## 2014-02-27 NOTE — Assessment & Plan Note (Signed)
His symptoms are class 2A. No change in meds. I have encouraged him to reduce his sodium intake.

## 2014-02-27 NOTE — Progress Notes (Signed)
HPI [name] returns today for followup. He is a pleasant 80 yo man with a h/o chronic systolic heart failure, ventricular tachycardia, paroxysmal atrial fibrillation, and coronary artery disease. The patient is status post ICD generator replacement.  He returns today for followup. He has had no recent ICD shocks and has tolerated sotalol very well. He has minimal peripheral edema. He does admit to some sodium and dietary indiscretion and has class II heart failure symptoms. No syncope. Allergies  Allergen Reactions  . Penicillins Swelling  . Tape Other (See Comments)    Itch/break out/redness. Paper tape OK     Current Outpatient Prescriptions  Medication Sig Dispense Refill  . amLODipine (NORVASC) 5 MG tablet TAKE ONE TABLET BY MOUTH TWICE DAILY  180 tablet  0  . aspirin EC 81 MG tablet Take 81 mg by mouth daily.      Marland Kitchen atorvastatin (LIPITOR) 20 MG tablet TAKE ONE TABLET BY MOUTH ONCE DAILY  90 tablet  0  . carvedilol (COREG) 6.25 MG tablet TAKE ONE TABLET BY MOUTH TWICE DAILY  180 tablet  0  . citalopram (CELEXA) 10 MG tablet Take 10 mg by mouth at bedtime.       . diphenhydramine-acetaminophen (TYLENOL PM) 25-500 MG TABS Take 2 tablets by mouth at bedtime.      . furosemide (LASIX) 40 MG tablet Take 0.5 tablets (20 mg total) by mouth daily.  45 tablet  3  . lisinopril (PRINIVIL,ZESTRIL) 10 MG tablet Take 1 tablet (10 mg total) by mouth daily.  90 tablet  3  . sodium chloride (OCEAN) 0.65 % nasal spray Place 1-2 sprays into the nose daily as needed for congestion.      . sotalol (BETAPACE) 120 MG tablet TAKE ONE TABLET BY MOUTH TWICE DAILY  180 tablet  0  . spironolactone (ALDACTONE) 25 MG tablet TAKE ONE-HALF TABLET BY MOUTH ONCE DAILY  45 tablet  3  . warfarin (COUMADIN) 5 MG tablet Take as directed by coumadin clinic  90 tablet  1   No current facility-administered medications for this visit.     Past Medical History  Diagnosis Date  . PVCs (premature ventricular contractions)    . Heart palpitations   . Coronary artery disease   . Hypertension   . Atrial fibrillation   . Anxiety     CHRONIC  . Myocardial infarction 1995    remote anterior   . Ventricular tachycardia   . Bladder cancer   . Hyperthyroidism   . Patent foramen ovale   . Mural thrombus of heart   . Genitourinary disease     CANCER  . Hyperlipidemia   . Stroke   . Left ventricular dysfunction     ROS:   All systems reviewed and negative except as noted in the HPI.   Past Surgical History  Procedure Laterality Date  . Icd  2007  . Kidney surgery      REMOVED  . Bladder      PART OF IT REMOVED  . Cardiac catheterization    . Prostatectomy      TOTAL  . Eye surgery  2000    CATARACT BOTH EYES  . Hernia repair  1956     Family History  Problem Relation Age of Onset  . Heart attack Father      History   Social History  . Marital Status: Married    Spouse Name: N/A    Number of Children: N/A  . Years of Education:  N/A   Occupational History  . Not on file.   Social History Main Topics  . Smoking status: Former Smoker    Quit date: 05/09/1990  . Smokeless tobacco: Not on file  . Alcohol Use: No  . Drug Use: No  . Sexual Activity: No   Other Topics Concern  . Not on file   Social History Narrative  . No narrative on file     BP 112/62  Pulse 73  Ht 6\' 1"  (1.854 m)  Wt 233 lb 1.9 oz (105.743 kg)  BMI 30.76 kg/m2  Physical Exam:  Well appearing 80 year old man, NAD HEENT: Unremarkable Neck:  6 cm JVD, no thyromegally Back:  No CVA tenderness Lungs:  Clear with no wheezes, rales, or rhonchi. HEART:  Regular rate rhythm, no murmurs, no rubs, no clicks Abd:  soft, positive bowel sounds, no organomegally, no rebound, no guarding Ext:  2 plus pulses, no edema, no cyanosis, no clubbing Skin:  No rashes no nodules Neuro:  CN II through XII intact, motor grossly intact   DEVICE  Normal device function.  See PaceArt for details.   Assess/Plan:

## 2014-03-27 ENCOUNTER — Other Ambulatory Visit: Payer: Self-pay | Admitting: *Deleted

## 2014-03-27 MED ORDER — WARFARIN SODIUM 5 MG PO TABS: ORAL_TABLET | ORAL | Status: DC

## 2014-03-28 ENCOUNTER — Other Ambulatory Visit: Payer: Self-pay | Admitting: *Deleted

## 2014-03-28 MED ORDER — FUROSEMIDE 40 MG PO TABS: 20.0000 mg | ORAL_TABLET | Freq: Every day | ORAL | Status: DC

## 2014-04-10 ENCOUNTER — Ambulatory Visit (INDEPENDENT_AMBULATORY_CARE_PROVIDER_SITE_OTHER): Payer: Medicare Other | Admitting: *Deleted

## 2014-04-10 DIAGNOSIS — Z7901 Long term (current) use of anticoagulants: Secondary | ICD-10-CM

## 2014-04-10 DIAGNOSIS — Z5181 Encounter for therapeutic drug level monitoring: Secondary | ICD-10-CM

## 2014-04-10 DIAGNOSIS — I4891 Unspecified atrial fibrillation: Secondary | ICD-10-CM

## 2014-04-10 LAB — POCT INR: INR: 2.7

## 2014-04-17 ENCOUNTER — Encounter: Payer: Self-pay | Admitting: Cardiology

## 2014-04-17 ENCOUNTER — Ambulatory Visit (INDEPENDENT_AMBULATORY_CARE_PROVIDER_SITE_OTHER): Payer: Medicare Other | Admitting: Cardiology

## 2014-04-17 VITALS — BP 140/80 | HR 61 | Ht 73.0 in | Wt 233.0 lb

## 2014-04-17 DIAGNOSIS — I472 Ventricular tachycardia, unspecified: Secondary | ICD-10-CM

## 2014-04-17 DIAGNOSIS — I2510 Atherosclerotic heart disease of native coronary artery without angina pectoris: Secondary | ICD-10-CM

## 2014-04-17 DIAGNOSIS — E785 Hyperlipidemia, unspecified: Secondary | ICD-10-CM

## 2014-04-17 DIAGNOSIS — I4891 Unspecified atrial fibrillation: Secondary | ICD-10-CM

## 2014-04-17 DIAGNOSIS — I5022 Chronic systolic (congestive) heart failure: Secondary | ICD-10-CM

## 2014-04-17 LAB — CBC WITH DIFFERENTIAL/PLATELET
BASOS ABS: 0.1 10*3/uL (ref 0.0–0.1)
BASOS PCT: 0.8 % (ref 0.0–3.0)
Eosinophils Absolute: 0.3 10*3/uL (ref 0.0–0.7)
Eosinophils Relative: 2.8 % (ref 0.0–5.0)
HCT: 42.2 % (ref 39.0–52.0)
Hemoglobin: 14.2 g/dL (ref 13.0–17.0)
LYMPHS PCT: 14.2 % (ref 12.0–46.0)
Lymphs Abs: 1.5 10*3/uL (ref 0.7–4.0)
MCHC: 33.7 g/dL (ref 30.0–36.0)
MCV: 92.9 fl (ref 78.0–100.0)
MONO ABS: 0.4 10*3/uL (ref 0.1–1.0)
Monocytes Relative: 3.7 % (ref 3.0–12.0)
NEUTROS PCT: 78.5 % — AB (ref 43.0–77.0)
Neutro Abs: 8.2 10*3/uL — ABNORMAL HIGH (ref 1.4–7.7)
Platelets: 213 10*3/uL (ref 150.0–400.0)
RBC: 4.55 Mil/uL (ref 4.22–5.81)
RDW: 14.4 % (ref 11.5–15.5)
WBC: 10.4 10*3/uL (ref 4.0–10.5)

## 2014-04-17 LAB — BASIC METABOLIC PANEL
BUN: 21 mg/dL (ref 6–23)
CHLORIDE: 103 meq/L (ref 96–112)
CO2: 24 mEq/L (ref 19–32)
Calcium: 8.9 mg/dL (ref 8.4–10.5)
Creatinine, Ser: 1.5 mg/dL (ref 0.4–1.5)
GFR: 48.29 mL/min — ABNORMAL LOW (ref >60.00)
Glucose, Bld: 213 mg/dL — ABNORMAL HIGH (ref 70–99)
POTASSIUM: 4.4 meq/L (ref 3.5–5.1)
Sodium: 134 mEq/L — ABNORMAL LOW (ref 135–145)

## 2014-04-17 LAB — LIPID PANEL
CHOL/HDL RATIO: 5
CHOLESTEROL: 121 mg/dL (ref 0–200)
HDL: 25.6 mg/dL — AB (ref >39.00)
LDL Cholesterol: 58 mg/dL (ref 0–99)
NonHDL: 95.4
TRIGLYCERIDES: 185 mg/dL — AB (ref 0.0–149.0)
VLDL: 37 mg/dL (ref 0.0–40.0)

## 2014-04-17 MED ORDER — CARVEDILOL 6.25 MG PO TABS: ORAL_TABLET | ORAL | Status: DC
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
## 2014-04-17 NOTE — Patient Instructions (Signed)
Your physician wants you to follow-up in: 4 months with Dr. Aundra Dubin  Your physician recommends that you return for lab work today: BMP/Lipid/CBC  Your physician has recommended you make the following change in your medication:  1) Increase Carvedilol to 9.375 mg twice daily

## 2014-04-28 ENCOUNTER — Other Ambulatory Visit: Payer: Self-pay | Admitting: Cardiology

## 2014-05-05 ENCOUNTER — Other Ambulatory Visit: Payer: Self-pay | Admitting: Cardiology

## 2014-05-22 ENCOUNTER — Ambulatory Visit (INDEPENDENT_AMBULATORY_CARE_PROVIDER_SITE_OTHER): Payer: Medicare Other | Admitting: *Deleted

## 2014-05-22 DIAGNOSIS — Z5181 Encounter for therapeutic drug level monitoring: Secondary | ICD-10-CM

## 2014-05-22 DIAGNOSIS — I4891 Unspecified atrial fibrillation: Secondary | ICD-10-CM

## 2014-05-22 DIAGNOSIS — Z7901 Long term (current) use of anticoagulants: Secondary | ICD-10-CM

## 2014-05-22 LAB — POCT INR: INR: 2.2

## 2014-06-02 ENCOUNTER — Ambulatory Visit (INDEPENDENT_AMBULATORY_CARE_PROVIDER_SITE_OTHER): Payer: Medicare Other | Admitting: *Deleted

## 2014-06-02 DIAGNOSIS — I5022 Chronic systolic (congestive) heart failure: Secondary | ICD-10-CM | POA: Diagnosis not present

## 2014-06-02 DIAGNOSIS — I472 Ventricular tachycardia, unspecified: Secondary | ICD-10-CM

## 2014-06-02 DIAGNOSIS — I4729 Other ventricular tachycardia: Secondary | ICD-10-CM

## 2014-06-02 LAB — MDC_IDC_ENUM_SESS_TYPE_INCLINIC
Brady Statistic RV Percent Paced: 0.18 %
HighPow Impedance: 59.923
Lead Channel Impedance Value: 562.5 Ohm
Lead Channel Pacing Threshold Amplitude: 0.75 V
Lead Channel Pacing Threshold Pulse Width: 0.4 ms
Lead Channel Sensing Intrinsic Amplitude: 11.8 mV
Lead Channel Setting Pacing Pulse Width: 0.4 ms
Lead Channel Setting Sensing Sensitivity: 0.5 mV
MDC IDC MSMT BATTERY REMAINING LONGEVITY: 100.8 mo
MDC IDC MSMT LEADCHNL RV PACING THRESHOLD AMPLITUDE: 0.75 V
MDC IDC MSMT LEADCHNL RV PACING THRESHOLD PULSEWIDTH: 0.4 ms
MDC IDC PG SERIAL: 7139864
MDC IDC SESS DTM: 20160125151442
MDC IDC SET LEADCHNL RV PACING AMPLITUDE: 2.5 V
MDC IDC SET ZONE DETECTION INTERVAL: 460 ms
Zone Setting Detection Interval: 250 ms
Zone Setting Detection Interval: 315 ms

## 2014-06-02 NOTE — Progress Notes (Signed)
ICD check in clinic. Normal device function. Threshold and sensing consistent with previous device measurements. Impedance trends stable over time. 2 NSVT episodes---max dur. 14 bts, Max V 165bpm. Histogram distribution appropriate for patient and level of activity. No changes made this session. Device programmed at appropriate safety margins. Device programmed to optimize intrinsic conduction. Estimated longevity 8.0-8.4 years. Pt enrolled in remote follow-up. Plan to follow up with the [facility] on 4/25 @9 :30am. Patient education completed including shock plan. Vibration demonstrated for patient. Magnet given.

## 2014-06-17 ENCOUNTER — Encounter: Payer: Self-pay | Admitting: Internal Medicine

## 2014-07-03 ENCOUNTER — Ambulatory Visit (INDEPENDENT_AMBULATORY_CARE_PROVIDER_SITE_OTHER): Payer: Medicare Other | Admitting: *Deleted

## 2014-07-03 DIAGNOSIS — Z5181 Encounter for therapeutic drug level monitoring: Secondary | ICD-10-CM

## 2014-07-03 DIAGNOSIS — I4891 Unspecified atrial fibrillation: Secondary | ICD-10-CM

## 2014-07-03 DIAGNOSIS — Z7901 Long term (current) use of anticoagulants: Secondary | ICD-10-CM

## 2014-07-03 LAB — POCT INR: INR: 2.8

## 2014-07-07 ENCOUNTER — Other Ambulatory Visit: Payer: Self-pay | Admitting: Cardiology

## 2014-07-30 ENCOUNTER — Other Ambulatory Visit: Payer: Self-pay | Admitting: Cardiology

## 2014-08-05 ENCOUNTER — Other Ambulatory Visit: Payer: Self-pay | Admitting: Cardiology

## 2014-08-14 ENCOUNTER — Ambulatory Visit (INDEPENDENT_AMBULATORY_CARE_PROVIDER_SITE_OTHER): Payer: Medicare Other | Admitting: *Deleted

## 2014-08-14 DIAGNOSIS — Z5181 Encounter for therapeutic drug level monitoring: Secondary | ICD-10-CM

## 2014-08-14 DIAGNOSIS — I4891 Unspecified atrial fibrillation: Secondary | ICD-10-CM | POA: Diagnosis not present

## 2014-08-14 DIAGNOSIS — Z7901 Long term (current) use of anticoagulants: Secondary | ICD-10-CM

## 2014-08-14 LAB — POCT INR: INR: 2.4

## 2014-08-18 ENCOUNTER — Ambulatory Visit (INDEPENDENT_AMBULATORY_CARE_PROVIDER_SITE_OTHER): Payer: Medicare Other | Admitting: Cardiology

## 2014-08-18 ENCOUNTER — Encounter: Payer: Self-pay | Admitting: Cardiology

## 2014-08-18 ENCOUNTER — Encounter: Payer: Self-pay | Admitting: *Deleted

## 2014-08-18 VITALS — BP 118/58 | HR 60 | Ht 73.0 in | Wt 233.0 lb

## 2014-08-18 DIAGNOSIS — I5022 Chronic systolic (congestive) heart failure: Secondary | ICD-10-CM

## 2014-08-18 DIAGNOSIS — I2510 Atherosclerotic heart disease of native coronary artery without angina pectoris: Secondary | ICD-10-CM | POA: Diagnosis not present

## 2014-08-18 DIAGNOSIS — R0602 Shortness of breath: Secondary | ICD-10-CM

## 2014-08-18 DIAGNOSIS — I472 Ventricular tachycardia, unspecified: Secondary | ICD-10-CM

## 2014-08-18 LAB — BASIC METABOLIC PANEL
BUN: 35 mg/dL — ABNORMAL HIGH (ref 6–23)
CHLORIDE: 101 meq/L (ref 96–112)
CO2: 29 mEq/L (ref 19–32)
CREATININE: 1.62 mg/dL — AB (ref 0.40–1.50)
Calcium: 9.5 mg/dL (ref 8.4–10.5)
GFR: 43.47 mL/min — ABNORMAL LOW (ref >60.00)
Glucose, Bld: 268 mg/dL — ABNORMAL HIGH (ref 70–99)
POTASSIUM: 5.4 meq/L — AB (ref 3.5–5.1)
Sodium: 134 mEq/L — ABNORMAL LOW (ref 135–145)

## 2014-08-18 LAB — BRAIN NATRIURETIC PEPTIDE: Pro B Natriuretic peptide (BNP): 106 pg/mL — ABNORMAL HIGH (ref 0.0–100.0)

## 2014-08-18 MED ORDER — CARVEDILOL 12.5 MG PO TABS: 12.5000 mg | ORAL_TABLET | Freq: Two times a day (BID) | ORAL | Status: DC

## 2014-08-18 NOTE — Progress Notes (Signed)
Patient ID: [name] [name], male   DOB: [birth year], 81 y.o.   MRN: [id number] PCP: Dr. Marisue Humble  81 yo with history of CAD, ischemic cardiomyopathy, and paroxysmal atrial fibrillation presents for cardiology followup.  He is in NSR today on sotalol.  No recent tachypalpitations.  Stable exertional dyspnea: short of breath with more than one flight of steps or when walking up a hill.  No problem on flat ground.  Some dyspnea with heavier yardwork like raking.  He does fine walking around the grocery store and [employer].  No chest pain. No orthopnea or PND.  Weight is stable. He is doing fine on warfarin with no melena or BRBPR.      ECG: NSR, old ASMI, 1st degree AVB, QTc interval 462 msec  Labs (4/12): LDL 56, HDL 31 Labs (10/12): K 4.4, creatinine 1.2, TSH normal, HCT 48 Labs (11/12): K 4.3, creatinine 1.2 (creatinine clearance > 60), LFTs normal, LDL 53, HDL 31 Labs (3/13): K 4.8, creatinine 1.4, Mg 2.1 Labs (6/13): HDL 35, LDL 63 Labs (7/13): BNP 112, K 4.8, creatinine 1.4 Labs (10/13): K 5, creatinine 1.3, BNP 90 Labs (1/14): K 4.6, creatinine 1.3 Labs (3/14): LDL 55, HDL 27 Labs (5/14): K 4.8, creatinine 1.4 Labs (10/14): K 4.6, creatinine 1.5 Labs (1/15): K 4.8, creatinine 1.5, BNP 68, HDL 29, LDL 69 Labs (8/15): K 4.6, creatinine 1.5, BNP 87 Labs (12/15): K 4.4, creatinine 1.5, HCT 42.2, LDL 58, HDL 26  PMH: 1.  CAD: Anterior MI 1995.  Myoview (6/09) with fixed anterior and apical defect, EF 36%.  2.  LV mural thrombus noted in 2006.  Patient had CVA related to this.  He is on chronic coumadin.  3.  Ischemic cardiomyopathy: Echo (10/12) with EF 30-35%, mild LV hypertrophy, apical septal and anterior akinesis, no LV thrombus seen, mild MR.  Patient has a Research officer, political party ICD.  4.  H/o VT 5.  Prostate cancer s/p radiation 6.  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  S/p multiple surgeries for excision and right nephrectomy.  7.  PFO 8.  Atrial fibrillation: Paroxysmal.  Developed  thyroid toxicity from amiodarone, now on sotalol in 2/10.   9.  HTN 10. Hyperlipidemia 11. Diabetes mellitus  12. 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  Has 3 sons.  Quit smoking in 1991.   FH: No premature CAD   ROS: All systems reviewed and negative except as per HPI.    Current Outpatient Prescriptions  Medication Sig Dispense Refill  . amLODipine (NORVASC) 5 MG tablet TAKE ONE TABLET BY MOUTH TWICE DAILY 180 tablet 0  . aspirin EC 81 MG tablet Take 81 mg by mouth daily.    Marland Kitchen atorvastatin (LIPITOR) 20 MG tablet TAKE ONE TABLET BY MOUTH ONCE DAILY 90 tablet 0  . citalopram (CELEXA) 10 MG tablet Take 10 mg by mouth at bedtime.     . diphenhydramine-acetaminophen (TYLENOL PM) 25-500 MG TABS Take 2 tablets by mouth at bedtime.    . furosemide (LASIX) 40 MG tablet Take 0.5 tablets (20 mg total) by mouth daily. 45 tablet 1  . lisinopril (PRINIVIL,ZESTRIL) 10 MG tablet TAKE ONE TABLET BY MOUTH ONCE DAILY 90 tablet 0  . sodium chloride (OCEAN) 0.65 % nasal spray Place 1-2 sprays into the nose daily as needed for congestion.    . sotalol (BETAPACE) 120 MG tablet TAKE ONE TABLET BY MOUTH TWICE DAILY 180 tablet 0  . spironolactone (  ALDACTONE) 25 MG tablet TAKE ONE-HALF TABLET BY MOUTH ONCE DAILY 45 tablet 3  . warfarin (COUMADIN) 5 MG tablet Take as directed by coumadin clinic 90 tablet 0  . carvedilol (COREG) 12.5 MG tablet Take 1 tablet (12.5 mg total) by mouth 2 (two) times daily. 180 tablet 3   No current facility-administered medications for this visit.    BP 118/58 mmHg  Pulse 60  Ht 6\' 1"  (1.854 m)  Wt 233 lb (105.688 kg)  BMI 30.75 kg/m2 General: NAD Neck: JVP 7 cm, no thyromegaly or thyroid nodule.  Lungs: Clear to auscultation bilaterally with normal respiratory effort. CV: Nondisplaced PMI.  Heart regular S1/S2, no S3/S4, 1/6 SEM.  1+ ankle edema.  No carotid bruit.  Normal pedal pulses.  Abdomen: Soft, nontender, no  hepatosplenomegaly, no distention.  Neurologic: Alert and oriented x 3.  Psych: Normal affect. Extremities: No clubbing or cyanosis.   Assessment/Plan:  Atrial fibrillation Paroxysmal. He is in NSR today with no recent symptoms concerning for atrial fibrillation. QT interval is stable on sotalol. Continue warfarin. Recent CBC ok. Would use warfarin rather than NOAC agent given h/o LV thrombus.   CAD Stable with no chest pain. Continue ASA 81, lisinopril, Coreg, and statin. Hyperlipidemia  Continue atorvastatin.  Good lipids in 12/15.  Systolic CHF, chronic  NYHA class II symptoms, appears euvolemic. St Jude ICD with narrow QRS so not CRT candidate.  - With elevated creatinine will not change ACEI dosing.   - Continue current Lasix and spironolactone.  - Increase Coreg to 12.5 mg bid.  - I will arrange for an echocardiogram.  CKD BMET today, creatinine has been stable at 1.5.   Followup in 4 months.   [name] [name] 08/18/2014

## 2014-08-18 NOTE — Patient Instructions (Addendum)
Medication Instructions:   Increase coreg (carvedilol) to 12.5mg  two times a day. You can take 2 of your 6.25mg  tablets two times a day and use your current supply.  Labwork:  BMET/BNP today  Testing/Procedures: Your physician has requested that you have an echocardiogram. Echocardiography is a painless test that uses sound waves to create images of your heart. It provides your doctor with information about the size and shape of your heart and how well your heart's chambers and valves are working. This procedure takes approximately one hour. There are no restrictions for this procedure.    Follow-Up: 4 months with Dr Aundra Dubin.

## 2014-08-19 ENCOUNTER — Other Ambulatory Visit: Payer: Self-pay | Admitting: *Deleted

## 2014-08-19 DIAGNOSIS — E875 Hyperkalemia: Secondary | ICD-10-CM

## 2014-08-21 ENCOUNTER — Encounter: Payer: Self-pay | Admitting: Internal Medicine

## 2014-08-21 ENCOUNTER — Ambulatory Visit: Payer: Medicare Other | Admitting: Cardiology

## 2014-08-26 ENCOUNTER — Other Ambulatory Visit (INDEPENDENT_AMBULATORY_CARE_PROVIDER_SITE_OTHER): Payer: Medicare Other | Admitting: *Deleted

## 2014-08-26 ENCOUNTER — Ambulatory Visit (HOSPITAL_COMMUNITY): Payer: Medicare Other | Attending: Cardiology | Admitting: Radiology

## 2014-08-26 DIAGNOSIS — R0602 Shortness of breath: Secondary | ICD-10-CM | POA: Diagnosis present

## 2014-08-26 DIAGNOSIS — I2510 Atherosclerotic heart disease of native coronary artery without angina pectoris: Secondary | ICD-10-CM | POA: Diagnosis not present

## 2014-08-26 DIAGNOSIS — E875 Hyperkalemia: Secondary | ICD-10-CM | POA: Diagnosis not present

## 2014-08-26 DIAGNOSIS — I5022 Chronic systolic (congestive) heart failure: Secondary | ICD-10-CM | POA: Diagnosis not present

## 2014-08-26 LAB — BASIC METABOLIC PANEL
BUN: 30 mg/dL — AB (ref 6–23)
CHLORIDE: 104 meq/L (ref 96–112)
CO2: 28 meq/L (ref 19–32)
Calcium: 9.2 mg/dL (ref 8.4–10.5)
Creatinine, Ser: 1.72 mg/dL — ABNORMAL HIGH (ref 0.40–1.50)
GFR: 40.56 mL/min — ABNORMAL LOW (ref >60.00)
Glucose, Bld: 275 mg/dL — ABNORMAL HIGH (ref 70–99)
POTASSIUM: 5.2 meq/L — AB (ref 3.5–5.1)
SODIUM: 136 meq/L (ref 135–145)

## 2014-08-26 MED ORDER — PERFLUTREN LIPID MICROSPHERE
4.0000 mL | Freq: Once | INTRAVENOUS | Status: AC
  Administered 2014-08-26: 4 mL via INTRAVENOUS

## 2014-08-26 NOTE — Progress Notes (Signed)
Echocardiogram performed with [facility].  

## 2014-09-08 ENCOUNTER — Ambulatory Visit (INDEPENDENT_AMBULATORY_CARE_PROVIDER_SITE_OTHER): Payer: Medicare Other | Admitting: *Deleted

## 2014-09-08 DIAGNOSIS — I472 Ventricular tachycardia, unspecified: Secondary | ICD-10-CM

## 2014-09-08 DIAGNOSIS — I4729 Other ventricular tachycardia: Secondary | ICD-10-CM

## 2014-09-08 DIAGNOSIS — I5022 Chronic systolic (congestive) heart failure: Secondary | ICD-10-CM

## 2014-09-08 DIAGNOSIS — I4891 Unspecified atrial fibrillation: Secondary | ICD-10-CM

## 2014-09-08 DIAGNOSIS — I493 Ventricular premature depolarization: Secondary | ICD-10-CM | POA: Diagnosis not present

## 2014-09-08 NOTE — Progress Notes (Signed)
ICD check in clinic. Normal device function. Threshold and sensing consistent with previous device measurements. Impedance trends stable over time. (1) ventricular arrhythmia x 14 bts @ 138bpm. Histogram distribution appropriate for patient and level of activity. Stable thoracic impedance---last abn x 8 days in late April, x 7 days in late March. No changes made this session. Device programmed at appropriate safety margins. Device programmed to optimize intrinsic conduction. Estimated longevity 7.8-8.2 years. Plan to follow up with the [facility] in 3 months and with [facility] in 02-2015. Patient education completed including shock plan. Vibration demonstrated for patient.

## 2014-09-09 LAB — CUP PACEART INCLINIC DEVICE CHECK
Battery Remaining Longevity: 98.4 mo
Brady Statistic RV Percent Paced: 0.19 %
Date Time Interrogation Session: 20160502142045
HighPow Impedance: 56.2261
Lead Channel Impedance Value: 512.5 Ohm
Lead Channel Pacing Threshold Amplitude: 0.5 V
Lead Channel Setting Pacing Amplitude: 2.5 V
Lead Channel Setting Pacing Pulse Width: 0.4 ms
Lead Channel Setting Sensing Sensitivity: 0.5 mV
MDC IDC MSMT LEADCHNL RV PACING THRESHOLD AMPLITUDE: 0.5 V
MDC IDC MSMT LEADCHNL RV PACING THRESHOLD PULSEWIDTH: 0.4 ms
MDC IDC MSMT LEADCHNL RV PACING THRESHOLD PULSEWIDTH: 0.4 ms
MDC IDC MSMT LEADCHNL RV SENSING INTR AMPL: 11.8 mV
MDC IDC SET ZONE DETECTION INTERVAL: 250 ms
Pulse Gen Serial Number: 7139864
Zone Setting Detection Interval: 315 ms
Zone Setting Detection Interval: 460 ms

## 2014-09-22 ENCOUNTER — Other Ambulatory Visit: Payer: Self-pay | Admitting: Cardiology

## 2014-09-25 ENCOUNTER — Ambulatory Visit (INDEPENDENT_AMBULATORY_CARE_PROVIDER_SITE_OTHER): Payer: Medicare Other | Admitting: *Deleted

## 2014-09-25 DIAGNOSIS — I4891 Unspecified atrial fibrillation: Secondary | ICD-10-CM | POA: Diagnosis not present

## 2014-09-25 DIAGNOSIS — Z7901 Long term (current) use of anticoagulants: Secondary | ICD-10-CM | POA: Diagnosis not present

## 2014-09-25 DIAGNOSIS — Z5181 Encounter for therapeutic drug level monitoring: Secondary | ICD-10-CM | POA: Diagnosis not present

## 2014-09-25 LAB — POCT INR: INR: 3.7

## 2014-10-01 ENCOUNTER — Other Ambulatory Visit: Payer: Self-pay | Admitting: Cardiology

## 2014-10-02 NOTE — Telephone Encounter (Signed)
Per note 4.11.16

## 2014-10-07 ENCOUNTER — Encounter: Payer: Self-pay | Admitting: Internal Medicine

## 2014-10-27 ENCOUNTER — Other Ambulatory Visit: Payer: Self-pay | Admitting: Cardiology

## 2014-11-05 ENCOUNTER — Other Ambulatory Visit: Payer: Self-pay | Admitting: Cardiology

## 2014-11-06 ENCOUNTER — Ambulatory Visit (INDEPENDENT_AMBULATORY_CARE_PROVIDER_SITE_OTHER): Payer: Medicare Other | Admitting: *Deleted

## 2014-11-06 DIAGNOSIS — Z7901 Long term (current) use of anticoagulants: Secondary | ICD-10-CM

## 2014-11-06 DIAGNOSIS — Z5181 Encounter for therapeutic drug level monitoring: Secondary | ICD-10-CM | POA: Diagnosis not present

## 2014-11-06 DIAGNOSIS — I4891 Unspecified atrial fibrillation: Secondary | ICD-10-CM

## 2014-11-06 LAB — POCT INR: INR: 2.3

## 2014-11-07 NOTE — Telephone Encounter (Signed)
Per note 4.11.16

## 2014-12-10 ENCOUNTER — Ambulatory Visit (INDEPENDENT_AMBULATORY_CARE_PROVIDER_SITE_OTHER): Payer: Medicare Other | Admitting: *Deleted

## 2014-12-10 DIAGNOSIS — I472 Ventricular tachycardia, unspecified: Secondary | ICD-10-CM

## 2014-12-10 DIAGNOSIS — I5022 Chronic systolic (congestive) heart failure: Secondary | ICD-10-CM | POA: Diagnosis not present

## 2014-12-10 DIAGNOSIS — I4891 Unspecified atrial fibrillation: Secondary | ICD-10-CM | POA: Diagnosis not present

## 2014-12-10 DIAGNOSIS — Z7901 Long term (current) use of anticoagulants: Secondary | ICD-10-CM

## 2014-12-10 DIAGNOSIS — Z5181 Encounter for therapeutic drug level monitoring: Secondary | ICD-10-CM

## 2014-12-10 DIAGNOSIS — I4729 Other ventricular tachycardia: Secondary | ICD-10-CM

## 2014-12-10 LAB — POCT INR: INR: 2.4

## 2014-12-12 LAB — CUP PACEART INCLINIC DEVICE CHECK
Brady Statistic RV Percent Paced: 0.19 %
Date Time Interrogation Session: 20160803132528
HighPow Impedance: 53.0234
Lead Channel Pacing Threshold Amplitude: 0.5 V
Lead Channel Pacing Threshold Amplitude: 0.5 V
Lead Channel Pacing Threshold Pulse Width: 0.4 ms
Lead Channel Sensing Intrinsic Amplitude: 11.8 mV
Lead Channel Setting Pacing Amplitude: 2.5 V
Lead Channel Setting Pacing Pulse Width: 0.4 ms
Lead Channel Setting Sensing Sensitivity: 0.5 mV
MDC IDC MSMT BATTERY REMAINING LONGEVITY: 97.2 mo
MDC IDC MSMT LEADCHNL RV IMPEDANCE VALUE: 525 Ohm
MDC IDC MSMT LEADCHNL RV PACING THRESHOLD PULSEWIDTH: 0.4 ms
MDC IDC PG SERIAL: 7139864
MDC IDC SET ZONE DETECTION INTERVAL: 460 ms
Zone Setting Detection Interval: 250 ms
Zone Setting Detection Interval: 315 ms

## 2014-12-12 NOTE — Progress Notes (Signed)
ICD check in clinic. Normal device function. Threshold and sensing consistent with previous device measurements. Impedance trends stable over time. (6) ventricular arrhythmias---max dur. 12 sec. Histogram distribution appropriate for patient and level of activity. Stable thoracic impedance. No changes made this session. Device programmed at appropriate safety margins. Device programmed to optimize intrinsic conduction. Estimated longevity 7.7-8.1 years.  Plan to follow up with [facility] in 3 months. Vibration demonstrated for patient.

## 2015-01-09 ENCOUNTER — Encounter: Payer: Self-pay | Admitting: Internal Medicine

## 2015-01-21 ENCOUNTER — Ambulatory Visit (INDEPENDENT_AMBULATORY_CARE_PROVIDER_SITE_OTHER): Payer: Medicare Other | Admitting: *Deleted

## 2015-01-21 DIAGNOSIS — Z7901 Long term (current) use of anticoagulants: Secondary | ICD-10-CM

## 2015-01-21 DIAGNOSIS — I4891 Unspecified atrial fibrillation: Secondary | ICD-10-CM | POA: Diagnosis not present

## 2015-01-21 DIAGNOSIS — Z5181 Encounter for therapeutic drug level monitoring: Secondary | ICD-10-CM | POA: Diagnosis not present

## 2015-01-21 LAB — POCT INR: INR: 2.5

## 2015-01-26 ENCOUNTER — Other Ambulatory Visit: Payer: Self-pay | Admitting: Cardiology

## 2015-02-09 ENCOUNTER — Other Ambulatory Visit: Payer: Self-pay | Admitting: Cardiology

## 2015-03-03 ENCOUNTER — Encounter: Payer: Self-pay | Admitting: Internal Medicine

## 2015-03-03 ENCOUNTER — Ambulatory Visit (INDEPENDENT_AMBULATORY_CARE_PROVIDER_SITE_OTHER): Payer: Medicare Other | Admitting: Internal Medicine

## 2015-03-03 ENCOUNTER — Ambulatory Visit (INDEPENDENT_AMBULATORY_CARE_PROVIDER_SITE_OTHER): Payer: Medicare Other

## 2015-03-03 ENCOUNTER — Other Ambulatory Visit: Payer: Self-pay

## 2015-03-03 VITALS — BP 120/66 | HR 60 | Ht 73.0 in | Wt 234.8 lb

## 2015-03-03 DIAGNOSIS — I2510 Atherosclerotic heart disease of native coronary artery without angina pectoris: Secondary | ICD-10-CM | POA: Diagnosis not present

## 2015-03-03 DIAGNOSIS — I5022 Chronic systolic (congestive) heart failure: Secondary | ICD-10-CM

## 2015-03-03 DIAGNOSIS — Z5181 Encounter for therapeutic drug level monitoring: Secondary | ICD-10-CM

## 2015-03-03 DIAGNOSIS — I4891 Unspecified atrial fibrillation: Secondary | ICD-10-CM | POA: Diagnosis not present

## 2015-03-03 DIAGNOSIS — I472 Ventricular tachycardia, unspecified: Secondary | ICD-10-CM

## 2015-03-03 DIAGNOSIS — Z7901 Long term (current) use of anticoagulants: Secondary | ICD-10-CM

## 2015-03-03 DIAGNOSIS — Z95810 Presence of automatic (implantable) cardiac defibrillator: Secondary | ICD-10-CM | POA: Diagnosis not present

## 2015-03-03 LAB — CUP PACEART INCLINIC DEVICE CHECK
Battery Remaining Longevity: 94.8
Brady Statistic RV Percent Paced: 0.34 %
HighPow Impedance: 54 Ohm
Implantable Lead Implant Date: 20070117
Implantable Lead Location: 753860
Implantable Lead Model: 7001
Lead Channel Impedance Value: 525 Ohm
Lead Channel Pacing Threshold Pulse Width: 0.4 ms
Lead Channel Setting Pacing Amplitude: 2.5 V
Lead Channel Setting Pacing Pulse Width: 0.4 ms
Lead Channel Setting Sensing Sensitivity: 0.5 mV
MDC IDC MSMT LEADCHNL RV PACING THRESHOLD AMPLITUDE: 0.75 V
MDC IDC MSMT LEADCHNL RV PACING THRESHOLD AMPLITUDE: 0.75 V
MDC IDC MSMT LEADCHNL RV PACING THRESHOLD PULSEWIDTH: 0.4 ms
MDC IDC MSMT LEADCHNL RV SENSING INTR AMPL: 11.8 mV
MDC IDC SESS DTM: 20161025153457
Pulse Gen Serial Number: 7139864

## 2015-03-03 LAB — POCT INR: INR: 2

## 2015-03-03 NOTE — Assessment & Plan Note (Signed)
His St. Jude device is working normally. Will recheck in several months.  

## 2015-03-03 NOTE — Assessment & Plan Note (Signed)
His symptoms are class 2. He will continue his current meds.  

## 2015-03-03 NOTE — Progress Notes (Signed)
HPI Mr. Nixon returns today for followup. He is a pleasant 81 yo man with a h/o chronic systolic heart failure, ventricular tachycardia, paroxysmal atrial fibrillation, and coronary artery disease. The patient is status post ICD generator replacement.  He returns today for followup. He has had no recent ICD shocks and has tolerated sotalol very well. He has minimal peripheral edema. He does admit to some sodium and dietary indiscretion and has class II heart failure symptoms. No syncope. Allergies  Allergen Reactions  . Penicillins Swelling  . Tape Other (See Comments)    Itch/break out/redness. Paper tape OK     Current Outpatient Prescriptions  Medication Sig Dispense Refill  . amLODipine (NORVASC) 5 MG tablet TAKE ONE TABLET BY MOUTH TWICE DAILY 180 tablet 1  . aspirin EC 81 MG tablet Take 81 mg by mouth daily.    Marland Kitchen atorvastatin (LIPITOR) 20 MG tablet TAKE ONE TABLET BY MOUTH ONCE DAILY 90 tablet 1  . carvedilol (COREG) 12.5 MG tablet Take 1 tablet (12.5 mg total) by mouth 2 (two) times daily. 180 tablet 3  . citalopram (CELEXA) 10 MG tablet Take 10 mg by mouth at bedtime.     . diphenhydramine-acetaminophen (TYLENOL PM) 25-500 MG TABS Take 2 tablets by mouth at bedtime.    . furosemide (LASIX) 40 MG tablet TAKE ONE-HALF TABLET (20 MG TOTAL) BY MOUTH ONCE DAILY 45 tablet 1  . glimepiride (AMARYL) 1 MG tablet Take 1 mg by mouth daily with breakfast.    . lisinopril (PRINIVIL,ZESTRIL) 10 MG tablet TAKE ONE TABLET BY MOUTH ONCE DAILY 90 tablet 1  . sodium chloride (OCEAN) 0.65 % nasal spray Place 1-2 sprays into the nose daily as needed for congestion.    . sotalol (BETAPACE) 120 MG tablet TAKE ONE TABLET BY MOUTH TWICE DAILY 180 tablet 1  . warfarin (COUMADIN) 5 MG tablet TAKE AS DIRECTED BY COUMADIN CLINIC 90 tablet 0   No current facility-administered medications for this visit.     Past Medical History  Diagnosis Date  . PVCs (premature ventricular contractions)   . Heart  palpitations   . Coronary artery disease   . Hypertension   . Atrial fibrillation ([place])   . Anxiety     CHRONIC  . Myocardial infarction ([place]) 1995    remote anterior   . Ventricular tachycardia ([place])   . Bladder cancer ([place])   . Hyperthyroidism   . Patent foramen ovale   . Mural thrombus of heart ([place])   . Genitourinary disease     CANCER  . Hyperlipidemia   . Stroke ([place])   . Left ventricular dysfunction     ROS:   All systems reviewed and negative except as noted in the HPI.   Past Surgical History  Procedure Laterality Date  . Icd  2007  . Kidney surgery      REMOVED  . Bladder      PART OF IT REMOVED  . Cardiac catheterization    . Prostatectomy      TOTAL  . Eye surgery  2000    CATARACT BOTH EYES  . Hernia repair  1956  . Implantable cardioverter defibrillator generator change N/A 02/20/2013    Procedure: IMPLANTABLE CARDIOVERTER DEFIBRILLATOR GENERATOR CHANGE;  Surgeon: Evans Lance, MD;  Location[facility] CATH LAB;  Service: Cardiovascular;  Laterality: N/A;     Family History  Problem Relation Age of Onset  . Heart attack Father      Social History   Social History  .  Marital Status: Married    Spouse Name: N/A  . Number of Children: N/A  . Years of Education: N/A   Occupational History  . Not on file.   Social History Main Topics  . Smoking status: Former Smoker    Quit date: 05/09/1990  . Smokeless tobacco: Not on file  . Alcohol Use: No  . Drug Use: No  . Sexual Activity: No   Other Topics Concern  . Not on file   Social History Narrative     BP 120/66 mmHg  Pulse 60  Ht 6\' 1"  (1.854 m)  Wt 234 lb 12.8 oz (106.505 kg)  BMI 30.98 kg/m2  Physical Exam:  Well appearing 81 year old man, NAD HEENT: Unremarkable Neck:  6 cm JVD, no thyromegally Back:  No CVA tenderness Lungs:  Clear with no wheezes, rales, or rhonchi. HEART:  Regular rate rhythm, no murmurs, no rubs, no clicks Abd:  soft, obese, positive bowel sounds, no  organomegally, no rebound, no guarding Ext:  2 plus pulses, no edema, no cyanosis, no clubbing Skin:  No rashes no nodules Neuro:  CN II through XII intact, motor grossly intact   DEVICE  Normal device function.  See PaceArt for details.   Assess/Plan:

## 2015-03-03 NOTE — Assessment & Plan Note (Signed)
He has had no recurrent VT. No change in his meds.

## 2015-03-03 NOTE — Patient Instructions (Signed)
Medication Instructions:  Your physician recommends that you continue on your current medications as directed. Please refer to the Current Medication list given to you today.   Labwork: None ordered   Testing/Procedures: None ordered   Follow-Up: Your physician recommends that you schedule a follow-up appointment in: 3 months in the device clinic   Your physician wants you to follow-up in: 12 months with Dr Knox Saliva will receive a reminder letter in the mail two months in advance. If you don't receive a letter, please call our office to schedule the follow-up appointment.    Any Other Special Instructions Will Be Listed Below (If Applicable).     If you need a refill on your cardiac medications before your next appointment, please call your pharmacy.

## 2015-03-18 ENCOUNTER — Other Ambulatory Visit: Payer: Self-pay | Admitting: Cardiology

## 2015-04-04 ENCOUNTER — Other Ambulatory Visit: Payer: Self-pay | Admitting: Cardiology

## 2015-04-10 ENCOUNTER — Encounter: Payer: Self-pay | Admitting: Cardiology

## 2015-04-10 ENCOUNTER — Ambulatory Visit (INDEPENDENT_AMBULATORY_CARE_PROVIDER_SITE_OTHER): Payer: Medicare Other | Admitting: Cardiology

## 2015-04-10 VITALS — BP 142/79 | HR 72 | Ht 73.0 in | Wt 234.0 lb

## 2015-04-10 DIAGNOSIS — E785 Hyperlipidemia, unspecified: Secondary | ICD-10-CM

## 2015-04-10 DIAGNOSIS — R0602 Shortness of breath: Secondary | ICD-10-CM

## 2015-04-10 DIAGNOSIS — I4891 Unspecified atrial fibrillation: Secondary | ICD-10-CM | POA: Diagnosis not present

## 2015-04-10 DIAGNOSIS — I2510 Atherosclerotic heart disease of native coronary artery without angina pectoris: Secondary | ICD-10-CM

## 2015-04-10 DIAGNOSIS — Z79899 Other long term (current) drug therapy: Secondary | ICD-10-CM | POA: Diagnosis not present

## 2015-04-10 MED ORDER — CARVEDILOL 25 MG PO TABS: 25.0000 mg | ORAL_TABLET | Freq: Two times a day (BID) | ORAL | Status: DC

## 2015-04-10 MED ORDER — LISINOPRIL 10 MG PO TABS: 10.0000 mg | ORAL_TABLET | Freq: Every day | ORAL | Status: AC

## 2015-04-10 NOTE — Progress Notes (Signed)
04/10/2015 [name] [name]   May 25, 1929  [id number]  Primary Physician Simona Huh, MD Primary Cardiologist: Dr. Aundra Dubin Electrophysiologist: Dr. Lovena Le  Reason for Visit/CC:  F/u for CAD/ ischemic cardiomyopathy and PAF  HPI:  81 y/o male with a history of CAD, ischemic cardiomyopathy, chronic systolic heart failure with an EF of 35-40% on most recent echo 08/2014, status post single-chamber St. Jude ICD, h/o VT and paroxysmal atrial fibrillation. He is on AAD therapy with sotalol and Coumadin for anticoagulation. He was on amiodarone in the past however this was discontinued due to thyroid toxicity. He was also once on a higher dose of sotalol, at 160 mg daily however this was reduced by Dr. Aundra Dubin in October 2013 due to a decrease in his GFR (has a solitary kidney secondary to renal cancer). His INRs are followed in our Coumadin clinic. He is followed in EP clinic by Dr. Lovena Le. He is also followed by Dr. Aundra Dubin.   He presents to clinic today for routien f/u. Patient reports that he's done fairly well since his last office visit. However his EKG today shows that he is back in atrial fibrillation. Ventricular response is controlled in the 42s.  He denies any symptoms of chest pain, dyspnea, lightheadedness, dizziness, syncope/near-syncope. He feels occasional palpations. No weight gain, edema, orthopnea or PND. He reports full medication compliance. No abnormal bleeding or falls while on Coumadin. BP today is 142/79.   Current Outpatient Prescriptions  Medication Sig Dispense Refill  . amLODipine (NORVASC) 5 MG tablet TAKE ONE TABLET BY MOUTH TWICE DAILY 180 tablet 1  . aspirin EC 81 MG tablet Take 81 mg by mouth daily.    Marland Kitchen atorvastatin (LIPITOR) 20 MG tablet TAKE ONE TABLET BY MOUTH ONCE DAILY 90 tablet 1  . carvedilol (COREG) 12.5 MG tablet Take 1 tablet (12.5 mg total) by mouth 2 (two) times daily. 180 tablet 3  . citalopram (CELEXA) 10 MG tablet Take 10 mg by mouth at bedtime.     .  diphenhydramine-acetaminophen (TYLENOL PM) 25-500 MG TABS Take 2 tablets by mouth at bedtime.    . furosemide (LASIX) 40 MG tablet TAKE ONE-HALF TABLET BY MOUTH ONCE DAILY 45 tablet 0  . glimepiride (AMARYL) 1 MG tablet Take 1 mg by mouth daily with breakfast.    . lisinopril (PRINIVIL,ZESTRIL) 10 MG tablet TAKE ONE TABLET BY MOUTH ONCE DAILY 90 tablet 3  . sodium chloride (OCEAN) 0.65 % nasal spray Place 1-2 sprays into the nose daily as needed for congestion.    . sotalol (BETAPACE) 120 MG tablet TAKE ONE TABLET BY MOUTH TWICE DAILY 180 tablet 1  . warfarin (COUMADIN) 5 MG tablet TAKE AS DIRECTED BY COUMADIN CLINIC 90 tablet 0   No current facility-administered medications for this visit.    Allergies  Allergen Reactions  . Penicillins Swelling  . Tape Other (See Comments)    Itch/break out/redness. Paper tape OK    Social History   Social History  . Marital Status: Married    Spouse Name: N/A  . Number of Children: N/A  . Years of Education: N/A   Occupational History  . Not on file.   Social History Main Topics  . Smoking status: Former Smoker    Quit date: 05/09/1990  . Smokeless tobacco: Not on file  . Alcohol Use: No  . Drug Use: No  . Sexual Activity: No   Other Topics Concern  . Not on file   Social History Narrative  Review of Systems: General: negative for chills, fever, night sweats or weight changes.  Cardiovascular: negative for chest pain, dyspnea on exertion, edema, orthopnea, palpitations, paroxysmal nocturnal dyspnea or shortness of breath Dermatological: negative for rash Respiratory: negative for cough or wheezing Urologic: negative for hematuria Abdominal: negative for nausea, vomiting, diarrhea, bright red blood per rectum, melena, or hematemesis Neurologic: negative for visual changes, syncope, or dizziness All other systems reviewed and are otherwise negative except as noted above.    Blood pressure 142/79, pulse 72, height 6\' 1"   (1.854 m), weight 234 lb (106.142 kg).  General appearance: alert, cooperative and no distress Neck: no carotid bruit and no JVD Lungs: clear to auscultation bilaterally Heart: irregularly irregular, regular rate, no murmur, click, rub or gallop Extremities: no LEE Pulses: 2+ and symmetric Skin: warm and dry Neurologic: Grossly normal  EKG atrial fibrillation 72 bpm. QT/QTC 414/453 ms  ASSESSMENT AND PLAN:    1. CAD: Anterior MI 1995. Myoview (6/09) with fixed anterior and apical defect. EF 35-40%. He denies any symptoms of CP or dyspnea. Continue medical therapy with ASA, BB, statin and ACE-I.   2. Ischemic Cardiomyopathy: EF 35-40% on echo 08/2014. He has an ICD for low EF + h/o VT. Continue Coreg and lisinopril.  3. Chronic Systolic CHF: EF 123456. Volume is stable on 40 mg of Lasix. Continue BB + ACE-I therapy. Continue low sodium diet and daily weights. Patient advised to notify our office if any significant weight gain/ edema.  4. PAF:  Patient is back in atrial fibrillation however, his ventricular rate is controlled in the 70s. He is currently on sotalol at a dose of 120 mg. This was reduced in 02/2012 from 160 mg due reduction in GFR. He has a single kidney 2/2 renal cancer. Given this, we will not increase his sotalol back to original dose. He was previously on amiodarone, however this was discontinued due to thyroid toxicity. Since the patient is fairly asymptomatic, we will attempt to adjust his beta blocker to see if a slower rate will help convert him back to normal sinus rhythm. There is room in his heart rate and blood pressure to make this adjustment. We will increase his Coreg to 25 mg twice a day. He is to continue Coumadin for anticoagulation. I will notify Dr. Aundra Dubin for further recommendations regarding change in antiarrhythmic therapy versus attempt at direct-current cardioversion.   5. Chronic Coumadin Therapy: INR is followed in our Coumadin clinic. Scheduled for f/u  on 12/6. He denies any abnormal bleeding or falls.   6. HTN: BP is stable on current regimen at 140/79. Continue lisinopril. We will increase Coreg for atrial fibrillation.   7. HLD: on statin therapy with atorvastatin. He is overdue for f/u lipid assessment. Will order FLP + HFTs.  8. DM: on Amaryl. Followed by PCP.    PLAN  Will discuss with primary cardiologist, Dr. Aundra Dubin, possibility of change in AAD therapy vs DCCV vs rate control only strategy. F/u with Dr. Aundra Dubin in 3- 4 months. Continue routien f/u in EP clinic with Dr. Lovena Le as advised.   Lyda Jester PA-C 04/10/2015 3:55 PM

## 2015-04-10 NOTE — Patient Instructions (Addendum)
Medication Instructions:  Your physician has recommended you make the following change in your medication:   1- Increase Coreg 25 mg by mouth twice daily  Labwork: Your physician recommends that you return for lab work in: 1 week, CMET and fasting Lipid panel. Nothing to eat or drink after midnight the day before lab work is scheduled.   Testing/Procedures: NONE  Follow-Up: Your physician wants you to follow-up in: 3 months with Dr. Aundra Dubin.   If you need a refill on your cardiac medications before your next appointment, please call your pharmacy.

## 2015-04-13 ENCOUNTER — Telehealth: Payer: Self-pay | Admitting: *Deleted

## 2015-04-13 NOTE — Telephone Encounter (Signed)
Pt states he is in regular rhythm now, he checks his heart rate at pulse under his jaw and it feels regular.  Pt states he feels he went back in SR early yesterday morning, he feels fine this morning. Pt states he can tell when he goes in afib because it makes him feel bad.  Pt advised to notify our office if he goes back in afib, that Dr Aundra Dubin recommends cardioversion if he goes back in a fib. Pt advised he would need weekly INRs if he goes in afib and DCCV is recommended.  Pt verbalized understanding.

## 2015-04-14 ENCOUNTER — Ambulatory Visit (INDEPENDENT_AMBULATORY_CARE_PROVIDER_SITE_OTHER): Payer: Medicare Other | Admitting: *Deleted

## 2015-04-14 DIAGNOSIS — Z5181 Encounter for therapeutic drug level monitoring: Secondary | ICD-10-CM | POA: Diagnosis not present

## 2015-04-14 DIAGNOSIS — I4891 Unspecified atrial fibrillation: Secondary | ICD-10-CM | POA: Diagnosis not present

## 2015-04-14 DIAGNOSIS — Z7901 Long term (current) use of anticoagulants: Secondary | ICD-10-CM | POA: Diagnosis not present

## 2015-04-14 LAB — POCT INR: INR: 2

## 2015-04-17 ENCOUNTER — Other Ambulatory Visit (INDEPENDENT_AMBULATORY_CARE_PROVIDER_SITE_OTHER): Payer: Medicare Other | Admitting: *Deleted

## 2015-04-17 DIAGNOSIS — I10 Essential (primary) hypertension: Secondary | ICD-10-CM

## 2015-04-17 LAB — COMPREHENSIVE METABOLIC PANEL
ALBUMIN: 3.6 g/dL (ref 3.6–5.1)
ALT: 11 U/L (ref 9–46)
AST: 10 U/L (ref 10–35)
Alkaline Phosphatase: 72 U/L (ref 40–115)
BILIRUBIN TOTAL: 0.5 mg/dL (ref 0.2–1.2)
BUN: 22 mg/dL (ref 7–25)
CHLORIDE: 106 mmol/L (ref 98–110)
CO2: 24 mmol/L (ref 20–31)
CREATININE: 1.51 mg/dL — AB (ref 0.70–1.11)
Calcium: 9 mg/dL (ref 8.6–10.3)
GLUCOSE: 132 mg/dL — AB (ref 65–99)
Potassium: 4.7 mmol/L (ref 3.5–5.3)
SODIUM: 139 mmol/L (ref 135–146)
Total Protein: 6.8 g/dL (ref 6.1–8.1)

## 2015-04-27 ENCOUNTER — Telehealth: Payer: Self-pay | Admitting: *Deleted

## 2015-04-27 ENCOUNTER — Telehealth: Payer: Self-pay

## 2015-04-27 ENCOUNTER — Other Ambulatory Visit: Payer: Self-pay | Admitting: Cardiology

## 2015-04-27 NOTE — Telephone Encounter (Signed)
Called pt per Ellen Henri, PA-C.  Pt was advised that Dr. Aundra Dubin wanted him to come into the office to have his INR checked, starting this week, X's 3 and if his levels are therapeutic, then they will discuss DCCV due to A-fib.  Pt advised that he was not happy with the care that he has received from our office and that he was thinking about switching doctors.  He states that he had some blood work done 04/17/15 and noone called him regarding his results.  He was told that he was wanting to have a complete lab work up and when he called to get it scheduled, noone knew anything about it.  He also stated that he has tried to get an appt with Dr. Aundra Dubin and was told that he couldn't see him until December and when he came in, he was seeing a PA.  The only thing I could think of was to let him talk to the clinical manager and go over everything and hopefully, have the pt satisfied.  Pt was transferred to Georgana Curio, Publishing copy.  Pt verbalized understanding.

## 2015-04-27 NOTE — Telephone Encounter (Signed)
Call transferred from [facility] because pt verbalized frustration about not receiving ECHO results from April, and not being able to get an appt. With Dr Aundra Dubin as instructed 4 months from previous appointment.  Explained issues with access are being addressed as diligently as possible.  Forwarded patient's question about ECHO result to supervisor

## 2015-04-28 ENCOUNTER — Telehealth: Payer: Self-pay | Admitting: *Deleted

## 2015-04-28 NOTE — Telephone Encounter (Signed)
Larey Dresser, MD  Etter Sjogren, RDCS Cc: Katrine Coho, RN           Is this about the 4/16 echo?   If so, Webb Silversmith can let patient know that EF is 35-40%, a little better than prior.     04/28/15--LMTCB for pt to let him know Dr Aundra Dubin reviewed April 2016 echo and discuss results with patient.

## 2015-04-28 NOTE — Telephone Encounter (Signed)
Pt.notified

## 2015-04-30 ENCOUNTER — Ambulatory Visit (INDEPENDENT_AMBULATORY_CARE_PROVIDER_SITE_OTHER): Payer: Medicare Other | Admitting: *Deleted

## 2015-04-30 DIAGNOSIS — I4891 Unspecified atrial fibrillation: Secondary | ICD-10-CM

## 2015-04-30 DIAGNOSIS — Z5181 Encounter for therapeutic drug level monitoring: Secondary | ICD-10-CM

## 2015-04-30 DIAGNOSIS — Z7901 Long term (current) use of anticoagulants: Secondary | ICD-10-CM

## 2015-04-30 LAB — POCT INR: INR: 2.1

## 2015-05-07 ENCOUNTER — Ambulatory Visit (INDEPENDENT_AMBULATORY_CARE_PROVIDER_SITE_OTHER): Payer: Medicare Other | Admitting: *Deleted

## 2015-05-07 DIAGNOSIS — Z7901 Long term (current) use of anticoagulants: Secondary | ICD-10-CM | POA: Diagnosis not present

## 2015-05-07 DIAGNOSIS — I4891 Unspecified atrial fibrillation: Secondary | ICD-10-CM | POA: Diagnosis not present

## 2015-05-07 DIAGNOSIS — Z5181 Encounter for therapeutic drug level monitoring: Secondary | ICD-10-CM

## 2015-05-07 LAB — POCT INR: INR: 2.2

## 2015-05-08 NOTE — Telephone Encounter (Signed)
Please see Marissa Nestle note for follow up on patient's results.

## 2015-05-13 NOTE — Progress Notes (Signed)
Cardiology Office Note   Date:  05/14/2015   ID:  [name] [name], DOB 19-May-1929, MRN [id number]  PCP:  Simona Huh, MD  Primary Cardiologist: Dr. Aundra Dubin Electrophysiologist: Dr. Lovena Le   Reason for Visit/CC: PAF- discussion about DCCV    History of Present Illness: [name] [name] is a 82 y.o. male with a history of bladder/prostate cancer, CAD, ischemic cardiomyopathy, chronic systolic heart failure with an EF of 35-40% on most recent echo 08/2014, s/p single-chamber St. Jude ICD, h/o VT and PAF on coumadin who presents to clinic for follow up and discussion of possible DCCV.    He is on AAD therapy with sotalol and Coumadin for anticoagulation. He was on amiodarone in the past however this was discontinued due to thyroid toxicity. He was also once on a higher dose of sotalol, at 160 mg daily however this was reduced by Dr. Aundra Dubin in October 2013 due to a decrease in his GFR (has a solitary kidney secondary to renal cancer). His INRs are followed in our Coumadin clinic. He is followed in EP clinic by Dr. Lovena Le. He is also followed by Dr. Aundra Dubin.   He was seen by [name] Jester PA-C in clinic on 04/10/15 for f/u. His EKG showed that he was back in atrial fibrillation w/CVR (HR 70). BB was increased to Coreg to 25 mg BID.   Today he presents for follow up. The Monday after he saw [name] Jester PA-C he could tell that he had gone back into rhythm. ECG in the office today confirms this. He denies CP or SOB. When he is in afib he does get mild SOB. No LE edmea, orthopnea or PND. No dizziness or syncope. He is taking all of this medications as prescribed and is doing well.     Past Medical History  Diagnosis Date  . PVCs (premature ventricular contractions)   . Heart palpitations   . Coronary artery disease   . Hypertension   . Atrial fibrillation ([place])   . Anxiety     CHRONIC  . Myocardial infarction ([place]) 1995    remote anterior   . Ventricular tachycardia ([place])   .  Bladder cancer ([place])   . Hyperthyroidism   . Patent foramen ovale   . Mural thrombus of heart ([place])   . Genitourinary disease     CANCER  . Hyperlipidemia   . Stroke ([place])   . Left ventricular dysfunction     Past Surgical History  Procedure Laterality Date  . Icd  2007  . Kidney surgery      REMOVED  . Bladder      PART OF IT REMOVED  . Cardiac catheterization    . Prostatectomy      TOTAL  . Eye surgery  2000    CATARACT BOTH EYES  . Hernia repair  1956  . Implantable cardioverter defibrillator generator change N/A 02/20/2013    Procedure: IMPLANTABLE CARDIOVERTER DEFIBRILLATOR GENERATOR CHANGE;  Surgeon: Evans Lance, MD;  Location: [facility] CATH LAB;  Service: Cardiovascular;  Laterality: N/A;     Current Outpatient Prescriptions  Medication Sig Dispense Refill  . amLODipine (NORVASC) 5 MG tablet TAKE ONE TABLET BY MOUTH TWICE DAILY 180 tablet 1  . aspirin EC 81 MG tablet Take 81 mg by mouth daily.    Marland Kitchen atorvastatin (LIPITOR) 20 MG tablet TAKE ONE TABLET BY MOUTH ONCE DAILY 90 tablet 0  . carvedilol (COREG) 25 MG tablet Take 1 tablet (25 mg total) by mouth 2 (  two) times daily with a meal. 180 tablet 3  . citalopram (CELEXA) 10 MG tablet Take 10 mg by mouth at bedtime.     . diphenhydramine-acetaminophen (TYLENOL PM) 25-500 MG TABS Take 2 tablets by mouth at bedtime.    Marland Kitchen doxycycline (VIBRAMYCIN) 50 MG capsule Take 50 mg by mouth.    . furosemide (LASIX) 40 MG tablet TAKE ONE-HALF TABLET BY MOUTH ONCE DAILY 45 tablet 0  . glimepiride (AMARYL) 1 MG tablet Take 1 mg by mouth daily with breakfast.    . lisinopril (PRINIVIL,ZESTRIL) 10 MG tablet Take 1 tablet (10 mg total) by mouth daily. 90 tablet 3  . sodium chloride (OCEAN) 0.65 % nasal spray Place 1-2 sprays into the nose daily as needed for congestion.    . sotalol (BETAPACE) 120 MG tablet TAKE ONE TABLET BY MOUTH TWICE DAILY 180 tablet 1  . warfarin (COUMADIN) 5 MG tablet TAKE AS DIRECTED BY COUMADIN CLINIC 90 tablet 0    No current facility-administered medications for this visit.    Allergies:   Penicillins and Tape    Social History:  The patient  reports that he quit smoking about 25 years ago. He does not have any smokeless tobacco history on file. He reports that he does not drink alcohol or use illicit drugs.   Family History:  The patient's family history includes Heart attack in his father and maternal grandmother; Hypertension in his mother; Stroke in his paternal grandfather and sister.    ROS:  Please see the history of present illness.   Otherwise, review of systems are positive for NONE.   All other systems are reviewed and negative.    PHYSICAL EXAM: VS:  BP 126/56 mmHg  Pulse 62  Ht 6\' 1"  (1.854 m)  Wt 232 lb (105.235 kg)  BMI 30.62 kg/m2 , BMI Body mass index is 30.62 kg/(m^2). GEN: Well nourished, well developed, in no acute distress HEENT: normal Neck: no JVD, carotid bruits, or masses Cardiac: RRR; no murmurs, rubs, or gallops,no edema  Respiratory:  clear to auscultation bilaterally, normal work of breathing GI: soft, nontender, nondistended, + BS MS: no deformity or atrophy Skin: warm and dry, no rash Neuro:  Strength and sensation are intact Psych: euthymic mood, full affect   EKG:  EKG is ordered today. The ekg ordered today demonstrates HR 62. Sinus with old anteroseptal infarct.    Recent Labs: 08/18/2014: Pro B Natriuretic peptide (BNP) 106.0* 04/17/2015: ALT 11; BUN 22; Creat 1.51*; Potassium 4.7; Sodium 139    Lipid Panel    Component Value Date/Time   CHOL 121 04/17/2014 0903   TRIG 185.0* 04/17/2014 0903   HDL 25.60* 04/17/2014 0903   CHOLHDL 5 04/17/2014 0903   VLDL 37.0 04/17/2014 0903   LDLCALC 58 04/17/2014 0903   LDLDIRECT 68.9 05/27/2013 0817      Wt Readings from Last 3 Encounters:  05/14/15 232 lb (105.235 kg)  04/10/15 234 lb (106.142 kg)  03/03/15 234 lb 12.8 oz (106.505 kg)      Other studies Reviewed: Additional studies/ records  that were reviewed today include: 2D ECHO Review of the above records demonstrates:   2D ECHO: 08/26/2014 LV EF: 35% -  40% Study Conclusions - Left ventricle: The cavity size was mildly dilated. Wall thickness was increased in a pattern of mild LVH. Systolic function was moderately reduced. The estimated ejection fraction was in the range of 35% to 40%. There is akinesis of the anteroseptal and apical myocardium. Doppler parameters are  consistent with abnormal left ventricular relaxation (grade 1 diastolic dysfunction). Impressions: - Anteroseptal and apical akinesis (apex is aneurysmal); overall moderately reduced LV function wth EF 35-40; no thrombus noted using definity; grade 1 diastolic dysfunction; trace MR and TR.    ASSESSMENT AND PLAN:  GEGORY BALDASSARI is a 82 y.o. male with a history of CAD, ischemic cardiomyopathy, chronic systolic heart failure with an EF of 35-40% on most recent echo 08/2014, s/p single-chamber St. Jude ICD, h/o VT and PAF on coumadin who presents to clinic for follow up and discussion of possible DCCV.    PAF: Patient is back in atrial fibrillation however, his ventricular rate is controlled in the 70s. He is currently on sotalol at a dose of 120 mg. This was reduced in 02/2012 from 160 mg due reduction in GFR. He has a single kidney 2/2 renal cancer. Given this, we will not increase his sotalol back to original dose. He was previously on amiodarone, however this was discontinued due to thyroid toxicity. He is to continue Coumadin for anticoagulation. We were going to set him up for DCCV but we do not have to since he spontaneously converted into NSR.  Chronic Coumadin Therapy: INR is followed in our Coumadin clinic. He denies any abnormal bleeding or falls.   CAD: Anterior MI 1995. Myoview (6/09) with fixed anterior and apical defect. EF 35-40%. He denies any symptoms of CP or dyspnea. Continue medical therapy with ASA, BB, statin and  ACE-I.   Ischemic Cardiomyopathy: EF 35-40% on echo 08/2014. He has an ICD for low EF + h/o VT. Continue Coreg and lisinopril.  Chronic Systolic CHF: EF 123456. Volume is stable on 40 mg of Lasix. Continue BB + ACE-I therapy. Continue low sodium diet and daily weights. Patient advised to notify our office if any significant weight gain/ edema.  HTN: BP is stable on current regimen at 126/56. Continue lisinopril and Coreg  HLD: on statin therapy with atorvastatin.   DM: on Amaryl. Followed by PCP.   Current medicines are reviewed at length with the patient today.  The patient does not have concerns regarding medicines.  The following changes have been made:  no change  Labs/ tests ordered today include:  No orders of the defined types were placed in this encounter.     Disposition:   FU with Dr. Aundra Dubin as previously scheduled   Signed, Crista Luria  05/14/2015 10:45 AM    [address], [place], [place]  [address] Phone: [phone]; Fax: [phone]

## 2015-05-14 ENCOUNTER — Ambulatory Visit (INDEPENDENT_AMBULATORY_CARE_PROVIDER_SITE_OTHER): Payer: Medicare Other | Admitting: Physician Assistant

## 2015-05-14 ENCOUNTER — Encounter: Payer: Self-pay | Admitting: Physician Assistant

## 2015-05-14 ENCOUNTER — Ambulatory Visit (INDEPENDENT_AMBULATORY_CARE_PROVIDER_SITE_OTHER): Payer: Medicare Other | Admitting: *Deleted

## 2015-05-14 VITALS — BP 126/56 | HR 62 | Ht 73.0 in | Wt 232.0 lb

## 2015-05-14 DIAGNOSIS — I5022 Chronic systolic (congestive) heart failure: Secondary | ICD-10-CM

## 2015-05-14 DIAGNOSIS — Z7901 Long term (current) use of anticoagulants: Secondary | ICD-10-CM | POA: Diagnosis not present

## 2015-05-14 DIAGNOSIS — I4891 Unspecified atrial fibrillation: Secondary | ICD-10-CM | POA: Diagnosis not present

## 2015-05-14 DIAGNOSIS — Z5181 Encounter for therapeutic drug level monitoring: Secondary | ICD-10-CM

## 2015-05-14 LAB — POCT INR: INR: 1.9

## 2015-05-14 NOTE — Patient Instructions (Signed)
Medication Instructions:  Your physician recommends that you continue on your current medications as directed. Please refer to the Current Medication list given to you today.   Labwork: None ordered  Testing/Procedures: None ordered  Follow-Up: Your physician recommends that you keep your scheduled follow-up appointment with Dr. Aundra Dubin 07/21/15.   Any Other Special Instructions Will Be Listed Below (If Applicable).    If you need a refill on your cardiac medications before your next appointment, please call your pharmacy.

## 2015-06-05 ENCOUNTER — Encounter: Payer: Self-pay | Admitting: Internal Medicine

## 2015-06-05 ENCOUNTER — Ambulatory Visit (INDEPENDENT_AMBULATORY_CARE_PROVIDER_SITE_OTHER): Payer: Medicare Other | Admitting: *Deleted

## 2015-06-05 DIAGNOSIS — I5022 Chronic systolic (congestive) heart failure: Secondary | ICD-10-CM

## 2015-06-05 DIAGNOSIS — Z95810 Presence of automatic (implantable) cardiac defibrillator: Secondary | ICD-10-CM | POA: Diagnosis not present

## 2015-06-05 LAB — CUP PACEART INCLINIC DEVICE CHECK
Date Time Interrogation Session: 20170127140407
HighPow Impedance: 56 Ohm
Implantable Lead Implant Date: 20070117
Implantable Lead Location: 753860
Lead Channel Pacing Threshold Amplitude: 0.5 V
Lead Channel Pacing Threshold Amplitude: 0.5 V
Lead Channel Pacing Threshold Pulse Width: 0.4 ms
Lead Channel Pacing Threshold Pulse Width: 0.4 ms
Lead Channel Setting Pacing Amplitude: 2.5 V
MDC IDC LEAD MODEL: 7001
MDC IDC MSMT BATTERY REMAINING LONGEVITY: 93.6
MDC IDC MSMT LEADCHNL RV IMPEDANCE VALUE: 475 Ohm
MDC IDC MSMT LEADCHNL RV SENSING INTR AMPL: 11.8 mV
MDC IDC PG SERIAL: 7139864
MDC IDC SET LEADCHNL RV PACING PULSEWIDTH: 0.4 ms
MDC IDC SET LEADCHNL RV SENSING SENSITIVITY: 0.5 mV
MDC IDC STAT BRADY RV PERCENT PACED: 1 %

## 2015-06-05 NOTE — Progress Notes (Signed)
ICD check in clinic. Normal device function. Thresholds and sensing consistent with previous device measurements. Impedance trends stable over time. No evidence of any ventricular arrhythmias. No mode switches. Histogram distribution appropriate for patient and level of activity. Corvue stable at this time, last abnormal 05/22/15 x 9 days. No changes made this session. Device programmed at appropriate safety margins. Device programmed to optimize intrinsic conduction. Estimated longevity 7.4-7.8 years. ROV with device clinic 09/07/15, ROV with GT in October.

## 2015-06-11 ENCOUNTER — Ambulatory Visit (INDEPENDENT_AMBULATORY_CARE_PROVIDER_SITE_OTHER): Payer: Medicare Other | Admitting: *Deleted

## 2015-06-11 DIAGNOSIS — Z7901 Long term (current) use of anticoagulants: Secondary | ICD-10-CM

## 2015-06-11 DIAGNOSIS — I4891 Unspecified atrial fibrillation: Secondary | ICD-10-CM

## 2015-06-11 DIAGNOSIS — Z5181 Encounter for therapeutic drug level monitoring: Secondary | ICD-10-CM

## 2015-06-11 LAB — POCT INR: INR: 2.2

## 2015-06-11 MED ORDER — WARFARIN SODIUM 5 MG PO TABS: ORAL_TABLET | ORAL | Status: DC

## 2015-06-17 ENCOUNTER — Other Ambulatory Visit: Payer: Self-pay | Admitting: Cardiology

## 2015-07-09 ENCOUNTER — Ambulatory Visit (INDEPENDENT_AMBULATORY_CARE_PROVIDER_SITE_OTHER): Payer: Medicare Other | Admitting: *Deleted

## 2015-07-09 DIAGNOSIS — I4891 Unspecified atrial fibrillation: Secondary | ICD-10-CM

## 2015-07-09 DIAGNOSIS — Z5181 Encounter for therapeutic drug level monitoring: Secondary | ICD-10-CM | POA: Diagnosis not present

## 2015-07-09 DIAGNOSIS — Z7901 Long term (current) use of anticoagulants: Secondary | ICD-10-CM | POA: Diagnosis not present

## 2015-07-09 LAB — POCT INR: INR: 2.2

## 2015-07-20 ENCOUNTER — Other Ambulatory Visit: Payer: Self-pay | Admitting: Cardiology

## 2015-07-21 ENCOUNTER — Ambulatory Visit (INDEPENDENT_AMBULATORY_CARE_PROVIDER_SITE_OTHER): Payer: Medicare Other | Admitting: Cardiology

## 2015-07-21 ENCOUNTER — Encounter: Payer: Self-pay | Admitting: Cardiology

## 2015-07-21 VITALS — BP 130/72 | HR 60 | Ht 73.0 in | Wt 234.0 lb

## 2015-07-21 DIAGNOSIS — I5022 Chronic systolic (congestive) heart failure: Secondary | ICD-10-CM | POA: Diagnosis not present

## 2015-07-21 DIAGNOSIS — I2510 Atherosclerotic heart disease of native coronary artery without angina pectoris: Secondary | ICD-10-CM

## 2015-07-21 DIAGNOSIS — E785 Hyperlipidemia, unspecified: Secondary | ICD-10-CM

## 2015-07-21 DIAGNOSIS — I4891 Unspecified atrial fibrillation: Secondary | ICD-10-CM | POA: Diagnosis not present

## 2015-07-21 LAB — BASIC METABOLIC PANEL
BUN: 23 mg/dL (ref 7–25)
CHLORIDE: 100 mmol/L (ref 98–110)
CO2: 29 mmol/L (ref 20–31)
CREATININE: 1.37 mg/dL — AB (ref 0.70–1.11)
Calcium: 9.5 mg/dL (ref 8.6–10.3)
Glucose, Bld: 116 mg/dL — ABNORMAL HIGH (ref 65–99)
POTASSIUM: 4.4 mmol/L (ref 3.5–5.3)
Sodium: 138 mmol/L (ref 135–146)

## 2015-07-21 LAB — CBC WITH DIFFERENTIAL/PLATELET
Basophils Absolute: 0.1 10*3/uL (ref 0.0–0.1)
Basophils Relative: 1 % (ref 0–1)
EOS ABS: 0.2 10*3/uL (ref 0.0–0.7)
EOS PCT: 2 % (ref 0–5)
HCT: 45.3 % (ref 39.0–52.0)
Hemoglobin: 15.4 g/dL (ref 13.0–17.0)
LYMPHS ABS: 1.5 10*3/uL (ref 0.7–4.0)
Lymphocytes Relative: 17 % (ref 12–46)
MCH: 30.7 pg (ref 26.0–34.0)
MCHC: 34 g/dL (ref 30.0–36.0)
MCV: 90.4 fL (ref 78.0–100.0)
MONOS PCT: 8 % (ref 3–12)
MPV: 9.4 fL (ref 8.6–12.4)
Monocytes Absolute: 0.7 10*3/uL (ref 0.1–1.0)
Neutro Abs: 6.6 10*3/uL (ref 1.7–7.7)
Neutrophils Relative %: 72 % (ref 43–77)
PLATELETS: 191 10*3/uL (ref 150–400)
RBC: 5.01 MIL/uL (ref 4.22–5.81)
RDW: 14.1 % (ref 11.5–15.5)
WBC: 9.1 10*3/uL (ref 4.0–10.5)

## 2015-07-21 LAB — LIPID PANEL
Cholesterol: 125 mg/dL (ref 125–200)
HDL: 32 mg/dL — AB (ref >=40)
LDL CALC: 71 mg/dL (ref <130)
TRIGLYCERIDES: 110 mg/dL (ref <150)
Total CHOL/HDL Ratio: 3.9 Ratio (ref <=5.0)
VLDL: 22 mg/dL (ref <30)

## 2015-07-21 MED ORDER — SOTALOL HCL 120 MG PO TABS: 120.0000 mg | ORAL_TABLET | Freq: Two times a day (BID) | ORAL | Status: DC

## 2015-07-21 NOTE — Patient Instructions (Signed)
Medication Instructions:  Your physician recommends that you continue on your current medications as directed. Please refer to the Current Medication list given to you today.   Labwork: Lab work to be done today--CBC, BMP, Lipid profile  Testing/Procedures: none  Follow-Up: Your physician wants you to follow-up in:  4 months.  You will receive a reminder letter in the mail two months in advance. If you don't receive a letter, please call our office to schedule the follow-up appointment.   Any Other Special Instructions Will Be Listed Below (If Applicable).     If you need a refill on your cardiac medications before your next appointment, please call your pharmacy.

## 2015-07-21 NOTE — Progress Notes (Signed)
Patient ID: [name] [name], male   DOB: [birth year], 82 y.o.   MRN: [id number] PCP: Dr. Marisue Humble  82 yo with history of CAD, ischemic cardiomyopathy, and paroxysmal atrial fibrillation presents for cardiology followup.  He is in NSR today on sotalol.  In 12/16, he went into atrial fibrillation briefly but has been back in NSR for the last couple of months. He knows when he is in atrial fibrillation due to fatigue.   Stable exertional dyspnea: short of breath with more than one flight of steps or when walking up a hill.  No problem on flat ground.  Some dyspnea with heavier yardwork like raking.  He does fine walking around the grocery store and [employer].  No chest pain. No orthopnea or PND.  Weight is up 2 lbs. He is doing fine on warfarin with no melena or BRBPR.      ECG: NSR, IVCD 128 msec, QTc 420, lateral TWIs  Labs (4/12): LDL 56, HDL 31 Labs (10/12): K 4.4, creatinine 1.2, TSH normal, HCT 48 Labs (11/12): K 4.3, creatinine 1.2 (creatinine clearance > 60), LFTs normal, LDL 53, HDL 31 Labs (3/13): K 4.8, creatinine 1.4, Mg 2.1 Labs (6/13): HDL 35, LDL 63 Labs (7/13): BNP 112, K 4.8, creatinine 1.4 Labs (10/13): K 5, creatinine 1.3, BNP 90 Labs (1/14): K 4.6, creatinine 1.3 Labs (3/14): LDL 55, HDL 27 Labs (5/14): K 4.8, creatinine 1.4 Labs (10/14): K 4.6, creatinine 1.5 Labs (1/15): K 4.8, creatinine 1.5, BNP 68, HDL 29, LDL 69 Labs (8/15): K 4.6, creatinine 1.5, BNP 87 Labs (12/15): K 4.4, creatinine 1.5, HCT 42.2, LDL 58, HDL 26 Labs (12/16): K 4.7, creatinine 1.5  PMH: 1.  CAD: Anterior MI 1995.  Myoview (6/09) with fixed anterior and apical defect, EF 36%.  2.  LV mural thrombus noted in 2006.  Patient had CVA related to this.  He is on chronic coumadin.  3.  Ischemic cardiomyopathy: Echo (10/12) with EF 30-35%, mild LV hypertrophy, apical septal and anterior akinesis, no LV thrombus seen, mild MR.  Patient has a Research officer, political party ICD.  Echo (4/16) with EF 35-40%, mild LV dilation,  anteroseptal and apical akinesis.  4.  H/o VT 5.  Prostate cancer s/p radiation 6.  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  S/p multiple surgeries for excision and right nephrectomy.  7.  PFO 8.  Atrial fibrillation: Paroxysmal.  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   9.  HTN 10. Hyperlipidemia 11. Diabetes mellitus  12. 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  Widower.  Has 3 sons.  Quit smoking in 1991.   FH: No premature CAD   ROS: All systems reviewed and negative except as per HPI.    Current Outpatient Prescriptions  Medication Sig Dispense Refill  . amLODipine (NORVASC) 5 MG tablet TAKE ONE TABLET BY MOUTH TWICE DAILY 180 tablet 0  . aspirin EC 81 MG tablet Take 81 mg by mouth daily.    Marland Kitchen atorvastatin (LIPITOR) 20 MG tablet TAKE ONE TABLET BY MOUTH ONCE DAILY 90 tablet 0  . carvedilol (COREG) 25 MG tablet Take 1 tablet (25 mg total) by mouth 2 (two) times daily with a meal. 180 tablet 3  . citalopram (CELEXA) 10 MG tablet Take 10 mg by mouth at bedtime.     . diphenhydramine-acetaminophen (TYLENOL PM) 25-500 MG TABS Take 2 tablets by mouth at bedtime.    . furosemide (LASIX) 40  MG tablet TAKE ONE-HALF TABLET BY MOUTH ONCE DAILY 45 tablet 3  . glimepiride (AMARYL) 1 MG tablet Take 1 mg by mouth daily with breakfast.    . lisinopril (PRINIVIL,ZESTRIL) 10 MG tablet Take 1 tablet (10 mg total) by mouth daily. 90 tablet 3  . sodium chloride (OCEAN) 0.65 % nasal spray Place 1-2 sprays into the nose daily as needed for congestion. Reported on 06/05/2015    . warfarin (COUMADIN) 5 MG tablet TAKE AS DIRECTED BY COUMADIN CLINIC 90 tablet 0  . sotalol (BETAPACE) 120 MG tablet Take 1 tablet (120 mg total) by mouth 2 (two) times daily. 180 tablet 3   No current facility-administered medications for this visit.    BP 130/72 mmHg  Pulse 60  Ht 6\' 1"  (1.854 m)  Wt 234 lb (106.142 kg)  BMI 30.88 kg/m2 General:  NAD Neck: JVP 7 cm, no thyromegaly or thyroid nodule.  Lungs: Clear to auscultation bilaterally with normal respiratory effort. CV: Nondisplaced PMI.  Heart regular S1/S2, no S3/S4, 1/6 SEM.  NSR.  No carotid bruit.  Normal pedal pulses.  Abdomen: Soft, nontender, no hepatosplenomegaly, no distention.  Neurologic: Alert and oriented x 3.  Psych: Normal affect. Extremities: No clubbing or cyanosis.   Assessment/Plan:  Atrial fibrillation Paroxysmal. He is in NSR today after brief atrial fibrillation breakthrough in 12/16. QT interval is stable on sotalol. Continue warfarin. Check CBC today. Would use warfarin rather than NOAC agent given h/o LV thrombus.   CAD Stable with no chest pain. Continue ASA 81, lisinopril, Coreg, and statin. Hyperlipidemia  Continue atorvastatin.  Check lipids.  Systolic CHF, chronic  NYHA class II symptoms, appears euvolemic. St Jude ICD.  QRS 128, rather marginal for CRT so would not recommend upgrade given minimal symptoms.  EF 35-40% on last echo in 4/16.  - With elevated creatinine will not change ACEI dosing.   - Continue current Lasix, check BMET.  - Continue Coreg.   CKD BMET today, creatinine has been stable at 1.5.   Followup in 4 months.   [name] 07/21/2015

## 2015-08-06 ENCOUNTER — Ambulatory Visit (INDEPENDENT_AMBULATORY_CARE_PROVIDER_SITE_OTHER): Payer: Medicare Other | Admitting: Pharmacist

## 2015-08-06 DIAGNOSIS — I4891 Unspecified atrial fibrillation: Secondary | ICD-10-CM | POA: Diagnosis not present

## 2015-08-06 DIAGNOSIS — Z7901 Long term (current) use of anticoagulants: Secondary | ICD-10-CM

## 2015-08-06 DIAGNOSIS — Z5181 Encounter for therapeutic drug level monitoring: Secondary | ICD-10-CM

## 2015-08-06 LAB — POCT INR: INR: 1.7

## 2015-09-03 ENCOUNTER — Ambulatory Visit (INDEPENDENT_AMBULATORY_CARE_PROVIDER_SITE_OTHER): Payer: Medicare Other | Admitting: *Deleted

## 2015-09-03 DIAGNOSIS — I4891 Unspecified atrial fibrillation: Secondary | ICD-10-CM

## 2015-09-03 DIAGNOSIS — Z7901 Long term (current) use of anticoagulants: Secondary | ICD-10-CM | POA: Diagnosis not present

## 2015-09-03 DIAGNOSIS — Z5181 Encounter for therapeutic drug level monitoring: Secondary | ICD-10-CM

## 2015-09-03 LAB — POCT INR: INR: 2.5

## 2015-09-07 ENCOUNTER — Other Ambulatory Visit (HOSPITAL_COMMUNITY)
Admission: RE | Admit: 2015-09-07 | Discharge: 2015-09-07 | Disposition: A | Discharge status: Home / Self Care | Payer: Medicare Other | Source: Ambulatory Visit | Attending: Internal Medicine | Admitting: Internal Medicine

## 2015-09-07 ENCOUNTER — Encounter: Payer: Self-pay | Admitting: Internal Medicine

## 2015-09-07 ENCOUNTER — Other Ambulatory Visit: Payer: Self-pay | Admitting: *Deleted

## 2015-09-07 ENCOUNTER — Ambulatory Visit (INDEPENDENT_AMBULATORY_CARE_PROVIDER_SITE_OTHER): Payer: Medicare Other | Admitting: Internal Medicine

## 2015-09-07 VITALS — BP 116/72 | HR 62 | Resp 18 | Wt 240.6 lb

## 2015-09-07 DIAGNOSIS — I480 Paroxysmal atrial fibrillation: Secondary | ICD-10-CM

## 2015-09-07 DIAGNOSIS — R0602 Shortness of breath: Secondary | ICD-10-CM

## 2015-09-07 DIAGNOSIS — I5022 Chronic systolic (congestive) heart failure: Secondary | ICD-10-CM | POA: Diagnosis not present

## 2015-09-07 DIAGNOSIS — I4891 Unspecified atrial fibrillation: Secondary | ICD-10-CM | POA: Insufficient documentation

## 2015-09-07 DIAGNOSIS — Z95810 Presence of automatic (implantable) cardiac defibrillator: Secondary | ICD-10-CM

## 2015-09-07 LAB — CUP PACEART INCLINIC DEVICE CHECK
Battery Remaining Longevity: 91.2
Date Time Interrogation Session: 20170501121238
HIGH POWER IMPEDANCE MEASURED VALUE: 51.1704
Implantable Lead Implant Date: 20070117
Implantable Lead Location: 753860
Lead Channel Pacing Threshold Amplitude: 0.5 V
Lead Channel Pacing Threshold Pulse Width: 0.4 ms
Lead Channel Setting Pacing Amplitude: 2.5 V
MDC IDC LEAD MODEL: 7001
MDC IDC MSMT LEADCHNL RV IMPEDANCE VALUE: 512.5 Ohm
MDC IDC MSMT LEADCHNL RV SENSING INTR AMPL: 11.8 mV
MDC IDC SET LEADCHNL RV PACING PULSEWIDTH: 0.4 ms
MDC IDC SET LEADCHNL RV SENSING SENSITIVITY: 0.5 mV
MDC IDC STAT BRADY RV PERCENT PACED: 0.6 %
Pulse Gen Serial Number: 7139864

## 2015-09-07 LAB — BASIC METABOLIC PANEL
ANION GAP: 11 (ref 5–15)
BUN: 22 mg/dL — AB (ref 6–20)
CALCIUM: 8.9 mg/dL (ref 8.9–10.3)
CO2: 25 mmol/L (ref 22–32)
Chloride: 101 mmol/L (ref 101–111)
Creatinine, Ser: 1.49 mg/dL — ABNORMAL HIGH (ref 0.61–1.24)
GFR calc Af Amer: 48 mL/min — ABNORMAL LOW (ref >60)
GFR, EST NON AFRICAN AMERICAN: 41 mL/min — AB (ref >60)
Glucose, Bld: 211 mg/dL — ABNORMAL HIGH (ref 65–99)
POTASSIUM: 5 mmol/L (ref 3.5–5.1)
SODIUM: 137 mmol/L (ref 135–145)

## 2015-09-07 LAB — BRAIN NATRIURETIC PEPTIDE: B NATRIURETIC PEPTIDE 5: 333.7 pg/mL — AB (ref 0.0–100.0)

## 2015-09-07 NOTE — Patient Instructions (Signed)
Your physician recommends that you continue on your current medications as directed. Please refer to the Current Medication list given to you today. Your physician recommends that you return for lab work in: today (BMET, BNP) We will be calling after your lab work is reviewed with any changes/follow up.

## 2015-09-07 NOTE — Progress Notes (Addendum)
Cardiology Office Note   Date:  09/07/2015   ID:  [name] [name], DOB 1930-01-19, MRN [id number]  PCP:  [name] Huh, MD  Cardiologist:   [name] [name] of atrial fib and CHF    History of Present Illness: [name] [name] is a 82 y.o. male with a history of  CAD, ischemc cardiomyopathy and PAF  Seen by [name] [name]  Pt in SR on sotalol in March  In December had afib  Fatigue    Pt has noted increase in wt  Increase fatigue over the past few weeks    Increased SOB  Abdomen is tense    SOB with lying flat   No CP       Outpatient Prescriptions Prior to Visit  Medication Sig Dispense Refill  . amLODipine (NORVASC) 5 MG tablet TAKE ONE TABLET BY MOUTH TWICE DAILY 180 tablet 0  . aspirin EC 81 MG tablet Take 81 mg by mouth daily.    Marland Kitchen atorvastatin (LIPITOR) 20 MG tablet TAKE ONE TABLET BY MOUTH ONCE DAILY 90 tablet 0  . carvedilol (COREG) 25 MG tablet Take 1 tablet (25 mg total) by mouth 2 (two) times daily with a meal. 180 tablet 3  . citalopram (CELEXA) 10 MG tablet Take 10 mg by mouth at bedtime.     . diphenhydramine-acetaminophen (TYLENOL PM) 25-500 MG TABS Take 2 tablets by mouth at bedtime.    . furosemide (LASIX) 40 MG tablet TAKE ONE-HALF TABLET BY MOUTH ONCE DAILY 45 tablet 3  . glimepiride (AMARYL) 1 MG tablet Take 1 mg by mouth daily with breakfast.    . lisinopril (PRINIVIL,ZESTRIL) 10 MG tablet Take 1 tablet (10 mg total) by mouth daily. 90 tablet 3  . sodium chloride (OCEAN) 0.65 % nasal spray Place 1-2 sprays into the nose daily as needed for congestion. Reported on 06/05/2015    . sotalol (BETAPACE) 120 MG tablet Take 1 tablet (120 mg total) by mouth 2 (two) times daily. 180 tablet 3  . warfarin (COUMADIN) 5 MG tablet TAKE AS DIRECTED BY COUMADIN CLINIC 90 tablet 0   No facility-administered medications prior to visit.     Allergies:   Penicillins and Tape   Past Medical History  Diagnosis Date  . PVCs (premature ventricular contractions)   . Heart  palpitations   . Coronary artery disease   . Hypertension   . Atrial fibrillation ([place])   . Anxiety     CHRONIC  . Myocardial infarction ([place]) 1995    remote anterior   . Ventricular tachycardia ([place])   . Bladder cancer ([place])   . Hyperthyroidism   . Patent foramen ovale   . Mural thrombus of heart ([place])   . Genitourinary disease     CANCER  . Hyperlipidemia   . Stroke ([place])   . Left ventricular dysfunction     Past Surgical History  Procedure Laterality Date  . Icd  2007  . Kidney surgery      REMOVED  . Bladder      PART OF IT REMOVED  . Cardiac catheterization    . Prostatectomy      TOTAL  . Eye surgery  2000    CATARACT BOTH EYES  . Hernia repair  1956  . Implantable cardioverter defibrillator generator change N/A 02/20/2013    Procedure: IMPLANTABLE CARDIOVERTER DEFIBRILLATOR GENERATOR CHANGE;  Surgeon: Evans Lance, MD;  Location: [facility] CATH LAB;  Service: Cardiovascular;  Laterality: N/A;  Social History:  The patient  reports that he quit smoking about 25 years ago. He does not have any smokeless tobacco history on file. He reports that he does not drink alcohol or use illicit drugs.   Family History:  The patient's family history includes Heart attack in his father and maternal grandmother; Hypertension in his mother; Stroke in his paternal grandfather and sister.    ROS:  Please see the history of present illness. All other systems are reviewed and  Negative to the above problem except as noted.    PHYSICAL EXAM: VS:  BP 116/72 mmHg  Pulse 62  Resp 18  Wt 240 lb 9.6 oz (109.135 kg)  GEN: Well nourished, well developed, in no acute distress HEENT: normal Neck: no JVD, carotid bruits, or masses Cardiac: RRR; no murmurs, rubs, or gallops,1-2+ edema  Respiratory:  clear to auscultation bilaterally, normal work of breathing GI: soft, nontender, nondistended, + BS  No hepatomegaly   Abdomen tense  MS: no deformity Moving all extremities   Skin: warm and  dry, no rash Neuro:  Strength and sensation are intact Psych: euthymic mood, full affect   EKG:  EKG is ordered today.  Atrial fib  65 bpm  LAFB  Anterior MI     Lipid Panel    Component Value Date/Time   CHOL 125 07/21/2015 1448   TRIG 110 07/21/2015 1448   HDL 32* 07/21/2015 1448   CHOLHDL 3.9 07/21/2015 1448   VLDL 22 07/21/2015 1448   LDLCALC 71 07/21/2015 1448   LDLDIRECT 68.9 05/27/2013 0817      Wt Readings from Last 3 Encounters:  09/07/15 240 lb 9.6 oz (109.135 kg)  07/21/15 234 lb (106.142 kg)  05/14/15 232 lb (105.235 kg)      ASSESSMENT AND PLAN:  1  PAF  Back in afib  Volume is up  Will review with [name] [name]    2  Acute on chronic systolic CHF  Volume is up on exam  Will need more lasix  BMET and BNP today before changing dose    3.  CAD  No symtpoms to sugg angina    4  HL  Keep on statin     Addendum   (5/2)  Reviewed labs  Discussed with [name] [name] Would recomm stopping Sotalol Take 1 1/2 lasix tomorrow and Thursday then 1 on Friday (40 mg) Get BMET on Friday  Drink OJ  Eat tomato and banana   Will need to get pt back in to see [name] [name] for CHF  Quick F/U Once volume is ptimized (stopping sotalol may help on own) could consider Tikosyn initiation     Signed, Dorris Carnes, MD  09/07/2015 9:39 AM    [name], [place], [place]  [address] Phon: [phone]; Fax: [phone]

## 2015-09-07 NOTE — Progress Notes (Signed)
ICD check in clinic. Normal device function. Thresholds and sensing consistent with previous device measurements. Impedance trends stable over time. 5 monitored NSVT episodes, longest ~14sec, V rates 139-146bpm. Histogram distribution appropriate for patient and level of activity. CorVue abnormal x10 days, noted AF on EKG, patient symptomatic (SOB, edema, weight gain), added-on with Dr. Harrington Challenger (DOD). No changes made this session. Device programmed at appropriate safety margins. Device programmed to optimize intrinsic conduction. Estimated longevity 7.2-7.6 years. Pt declines remote follow-up. Patient education completed including shock plan. Alert vibration demonstrated for patient. ROV with device clinic in 3 months and with [facility] in 02/2016.

## 2015-09-09 ENCOUNTER — Telehealth: Payer: Self-pay | Admitting: *Deleted

## 2015-09-09 DIAGNOSIS — I480 Paroxysmal atrial fibrillation: Secondary | ICD-10-CM

## 2015-09-09 MED ORDER — FUROSEMIDE 40 MG PO TABS: 40.0000 mg | ORAL_TABLET | Freq: Every day | ORAL | Status: DC

## 2015-09-09 NOTE — Telephone Encounter (Signed)
Addendum (5/2) Reviewed labs Discussed with [name] [name] Would recomm stopping Sotalol Take 1 1/2 lasix tomorrow and Thursday then 1 on Friday (40 mg) Get BMET on Friday Drink OJ Eat tomato and banana  Will need to get pt back in to see D Aundra Dubin for CHF Quick F/U Once volume is ptimized (stopping sotalol may help on own) could consider Tikosyn initiation     Message     Please see addendum Pt will need BMET on Friday at [facility] WIll need 40 [facility]     Needs Appt in CHF clinic with D Mclean rel quick     Called patient. Reviewed Dr. Alan Ripper instructions with him. Dr. Harrington Challenger called him yesterday evening to discuss above information so he was already aware. DC'd sotolol; sent script for lasix 40 mg 1 tablet daily to [facility] as pt requests. Lab appointment scheduled for 09/11/15. Will review with Dr. Claris Gladden nurse for follow up with him for CHF. Advised patient we will be calling him with that follow up information. He verbalizes understanding and appreciation.

## 2015-09-09 NOTE — Telephone Encounter (Signed)
Spoke with Reeves Forth at [facility], [phone]. She will send a message to [facility] for assistance finding an appointment for patient to be seen soon. They will call patient with appointment information.

## 2015-09-10 NOTE — Telephone Encounter (Signed)
Sent staff message to [employer] regarding a work in appointment with Dr. Aundra Dubin.

## 2015-09-11 ENCOUNTER — Other Ambulatory Visit (INDEPENDENT_AMBULATORY_CARE_PROVIDER_SITE_OTHER): Payer: Medicare Other | Admitting: *Deleted

## 2015-09-11 DIAGNOSIS — I480 Paroxysmal atrial fibrillation: Secondary | ICD-10-CM

## 2015-09-11 LAB — BASIC METABOLIC PANEL
BUN: 20 mg/dL (ref 7–25)
CHLORIDE: 101 mmol/L (ref 98–110)
CO2: 28 mmol/L (ref 20–31)
CREATININE: 1.48 mg/dL — AB (ref 0.70–1.11)
Calcium: 8.7 mg/dL (ref 8.6–10.3)
GLUCOSE: 183 mg/dL — AB (ref 65–99)
POTASSIUM: 4.3 mmol/L (ref 3.5–5.3)
SODIUM: 136 mmol/L (ref 135–146)

## 2015-10-01 ENCOUNTER — Ambulatory Visit (INDEPENDENT_AMBULATORY_CARE_PROVIDER_SITE_OTHER): Payer: Medicare Other | Admitting: *Deleted

## 2015-10-01 DIAGNOSIS — Z5181 Encounter for therapeutic drug level monitoring: Secondary | ICD-10-CM | POA: Diagnosis not present

## 2015-10-01 DIAGNOSIS — I4891 Unspecified atrial fibrillation: Secondary | ICD-10-CM | POA: Diagnosis not present

## 2015-10-01 DIAGNOSIS — Z7901 Long term (current) use of anticoagulants: Secondary | ICD-10-CM | POA: Diagnosis not present

## 2015-10-01 LAB — POCT INR: INR: 2.8

## 2015-10-06 ENCOUNTER — Ambulatory Visit (HOSPITAL_COMMUNITY)
Admission: RE | Admit: 2015-10-06 | Discharge: 2015-10-06 | Disposition: A | Discharge status: Home / Self Care | Payer: Medicare Other | Source: Ambulatory Visit | Attending: Cardiology | Admitting: Cardiology

## 2015-10-06 ENCOUNTER — Encounter (HOSPITAL_COMMUNITY): Payer: Self-pay

## 2015-10-06 VITALS — BP 128/72 | HR 80 | Resp 20 | Wt 233.5 lb

## 2015-10-06 DIAGNOSIS — Z5181 Encounter for therapeutic drug level monitoring: Secondary | ICD-10-CM | POA: Diagnosis not present

## 2015-10-06 DIAGNOSIS — Z7901 Long term (current) use of anticoagulants: Secondary | ICD-10-CM | POA: Insufficient documentation

## 2015-10-06 DIAGNOSIS — R0602 Shortness of breath: Secondary | ICD-10-CM | POA: Diagnosis not present

## 2015-10-06 DIAGNOSIS — R0989 Other specified symptoms and signs involving the circulatory and respiratory systems: Secondary | ICD-10-CM | POA: Diagnosis present

## 2015-10-06 DIAGNOSIS — F329 Major depressive disorder, single episode, unspecified: Secondary | ICD-10-CM | POA: Insufficient documentation

## 2015-10-06 DIAGNOSIS — I130 Hypertensive heart and chronic kidney disease with heart failure and stage 1 through stage 4 chronic kidney disease, or unspecified chronic kidney disease: Secondary | ICD-10-CM | POA: Insufficient documentation

## 2015-10-06 DIAGNOSIS — Z8546 Personal history of malignant neoplasm of prostate: Secondary | ICD-10-CM | POA: Insufficient documentation

## 2015-10-06 DIAGNOSIS — I252 Old myocardial infarction: Secondary | ICD-10-CM | POA: Insufficient documentation

## 2015-10-06 DIAGNOSIS — Z7982 Long term (current) use of aspirin: Secondary | ICD-10-CM | POA: Diagnosis not present

## 2015-10-06 DIAGNOSIS — Z8551 Personal history of malignant neoplasm of bladder: Secondary | ICD-10-CM | POA: Diagnosis not present

## 2015-10-06 DIAGNOSIS — I255 Ischemic cardiomyopathy: Secondary | ICD-10-CM | POA: Insufficient documentation

## 2015-10-06 DIAGNOSIS — I2510 Atherosclerotic heart disease of native coronary artery without angina pectoris: Secondary | ICD-10-CM | POA: Diagnosis not present

## 2015-10-06 DIAGNOSIS — Z87891 Personal history of nicotine dependence: Secondary | ICD-10-CM | POA: Diagnosis not present

## 2015-10-06 DIAGNOSIS — E785 Hyperlipidemia, unspecified: Secondary | ICD-10-CM | POA: Diagnosis not present

## 2015-10-06 DIAGNOSIS — Z8673 Personal history of transient ischemic attack (TIA), and cerebral infarction without residual deficits: Secondary | ICD-10-CM | POA: Insufficient documentation

## 2015-10-06 DIAGNOSIS — I481 Persistent atrial fibrillation: Secondary | ICD-10-CM | POA: Insufficient documentation

## 2015-10-06 DIAGNOSIS — N189 Chronic kidney disease, unspecified: Secondary | ICD-10-CM | POA: Diagnosis not present

## 2015-10-06 DIAGNOSIS — I513 Intracardiac thrombosis, not elsewhere classified: Secondary | ICD-10-CM

## 2015-10-06 DIAGNOSIS — I213 ST elevation (STEMI) myocardial infarction of unspecified site: Secondary | ICD-10-CM

## 2015-10-06 DIAGNOSIS — Z79899 Other long term (current) drug therapy: Secondary | ICD-10-CM | POA: Insufficient documentation

## 2015-10-06 DIAGNOSIS — Z7984 Long term (current) use of oral hypoglycemic drugs: Secondary | ICD-10-CM | POA: Insufficient documentation

## 2015-10-06 DIAGNOSIS — I5022 Chronic systolic (congestive) heart failure: Secondary | ICD-10-CM | POA: Diagnosis not present

## 2015-10-06 DIAGNOSIS — E1122 Type 2 diabetes mellitus with diabetic chronic kidney disease: Secondary | ICD-10-CM | POA: Insufficient documentation

## 2015-10-06 LAB — PROTIME-INR
INR: 2.74 — ABNORMAL HIGH (ref 0.00–1.49)
Prothrombin Time: 28.6 seconds — ABNORMAL HIGH (ref 11.6–15.2)

## 2015-10-06 MED ORDER — CARVEDILOL 12.5 MG PO TABS: 18.7500 mg | ORAL_TABLET | Freq: Two times a day (BID) | ORAL | Status: AC

## 2015-10-06 MED ORDER — SPIRONOLACTONE 25 MG PO TABS: 12.5000 mg | ORAL_TABLET | Freq: Every day | ORAL | Status: AC

## 2015-10-06 NOTE — Patient Instructions (Addendum)
DECREASE Carvedilol (Coreg) to 18.75 mg twice daily. Will send in new Rx, take 1.5 tabs twice daily.  START Spironolactone 12.5 mg (1/2 tab) once daily.  Routine lab work today. Will notify you of abnormal results, otherwise no news is good news!  Will schedule you for labs and pharmacy clinic appointment for Tikosyn initiation at [facility] in 1-2 weeks. Address: [address] ([place]), [place], [place] [address]  Phone: [phone]  _______________________________________________________  _______________________________________________________  Follow up 1 month with Dr. Aundra Dubin.  Do the following things EVERYDAY: 1) Weigh yourself in the morning before breakfast. Write it down and keep it in a log. 2) Take your medicines as prescribed 3) Eat low salt foods-Limit salt (sodium) to 2000 mg per day.  4) Stay as active as you can everyday 5) Limit all fluids for the day to less than 2 liters

## 2015-10-07 ENCOUNTER — Telehealth (HOSPITAL_COMMUNITY): Payer: Self-pay | Admitting: Cardiology

## 2015-10-07 NOTE — Telephone Encounter (Signed)
As requested, sent to coumadin clinic

## 2015-10-07 NOTE — Progress Notes (Signed)
Patient ID: [name] [name], male   DOB: 1930/04/07, 82 y.o.   MRN: [id number] PCP: Dr. Marisue Humble Cardiology: Dr. Aundra Dubin  82 yo with history of CAD, ischemic cardiomyopathy, and atrial fibrillation presents for cardiology followup.  In 5/17, he was seen as an add-on in the office with increased dyspnea.  He was found to be back in atrial fibrillation.  Lasix was increased and sotalol was stopped due to CHF.  He remains in atrial fibrillation today, rate is controlled.  Weight is down 1 lb.  He is breathing better on higher Lasix dose but still does not feel as good as he was feeling prior to going into atrial fibrillation.  He has felt more sluggish since Coreg was increased to 25 mg bid.  Currently, he is not short of breath if he walks slowly on flat ground.  He gets short of breath with fast walking and with carrying a heavy load like groceries.  No chest pain.  No orthopnea/PND.  No BRBPR or melena.    ECG: afib at 82, LAFB, old anterior MI, QTc 449 msec  Corevue: Stable thoracic impedence, no VT, < 1% v-pacing  Labs (4/12): LDL 56, HDL 31 Labs (10/12): K 4.4, creatinine 1.2, TSH normal, HCT 48 Labs (11/12): K 4.3, creatinine 1.2 (creatinine clearance > 60), LFTs normal, LDL 53, HDL 31 Labs (3/13): K 4.8, creatinine 1.4, Mg 2.1 Labs (6/13): HDL 35, LDL 63 Labs (7/13): BNP 112, K 4.8, creatinine 1.4 Labs (10/13): K 5, creatinine 1.3, BNP 90 Labs (1/14): K 4.6, creatinine 1.3 Labs (3/14): LDL 55, HDL 27 Labs (5/14): K 4.8, creatinine 1.4 Labs (10/14): K 4.6, creatinine 1.5 Labs (1/15): K 4.8, creatinine 1.5, BNP 68, HDL 29, LDL 69 Labs (8/15): K 4.6, creatinine 1.5, BNP 87 Labs (12/15): K 4.4, creatinine 1.5, HCT 42.2, LDL 58, HDL 26 Labs (12/16): K 4.7, creatinine 1.5 Labs (3/17): LDL 71, HDL 32 Labs (5/17): K 4.3, creatinine 1.48, BNP 334  PMH: 1.  CAD: Anterior MI 1995.  Myoview (6/09) with fixed anterior and apical defect, EF 36%.  2.  LV mural thrombus noted in 2006.  Patient had  CVA related to this.  He is on chronic coumadin.  3.  Ischemic cardiomyopathy: Echo (10/12) with EF 30-35%, mild LV hypertrophy, apical septal and anterior akinesis, no LV thrombus seen, mild MR.  Patient has a Research officer, political party ICD.  Echo (4/16) with EF 35-40%, mild LV dilation, anteroseptal and apical akinesis.  4.  H/o VT 5.  Prostate cancer s/p radiation 6.  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  S/p multiple surgeries for excision and right nephrectomy.  7.  PFO 8.  Atrial fibrillation: Persistent.  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  Taken off sotalol in 5/17 in the setting of CHF.   9.  HTN 10. Hyperlipidemia 11. Diabetes mellitus  12. CKD 13. 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  Has 3 sons.  Quit smoking in 1991.   FH: No premature CAD   ROS: All systems reviewed and negative except as per HPI.    Current Outpatient Prescriptions  Medication Sig Dispense Refill  . amLODipine (NORVASC) 5 MG tablet TAKE ONE TABLET BY MOUTH TWICE DAILY 180 tablet 0  . aspirin EC 81 MG tablet Take 81 mg by mouth daily.    Marland Kitchen atorvastatin (LIPITOR) 20 MG tablet TAKE ONE TABLET BY MOUTH ONCE DAILY 90 tablet 0  . carvedilol (COREG) 12.5 MG  tablet Take 1.5 tablets (18.75 mg total) by mouth 2 (two) times daily with a meal. 90 tablet 6  . citalopram (CELEXA) 10 MG tablet Take 10 mg by mouth at bedtime.     . diphenhydramine-acetaminophen (TYLENOL PM) 25-500 MG TABS Take 2 tablets by mouth at bedtime.    . furosemide (LASIX) 40 MG tablet Take 1 tablet (40 mg total) by mouth daily. 90 tablet 3  . glimepiride (AMARYL) 1 MG tablet Take 1 mg by mouth daily with breakfast.    . lisinopril (PRINIVIL,ZESTRIL) 10 MG tablet Take 1 tablet (10 mg total) by mouth daily. 90 tablet 3  . sodium chloride (OCEAN) 0.65 % nasal spray Place 1-2 sprays into the nose daily as needed for congestion. Reported on 06/05/2015    . warfarin (COUMADIN) 5 MG tablet TAKE AS  DIRECTED BY COUMADIN CLINIC 90 tablet 0  . spironolactone (ALDACTONE) 25 MG tablet Take 0.5 tablets (12.5 mg total) by mouth daily. 45 tablet 6   No current facility-administered medications for this encounter.    BP 128/72 mmHg  Pulse 80  Resp 20  Wt 233 lb 8 oz (105.915 kg)  SpO2 96% General: NAD Neck: JVP 7 cm, no thyromegaly or thyroid nodule.  Lungs: Clear to auscultation bilaterally with normal respiratory effort. CV: Nondisplaced PMI.  Heart irregular S1/S2, no S3/S4, 1/6 SEM.  1+ edema 1/4 to knees bilaterally.  No carotid bruit.  Normal pedal pulses.  Abdomen: Soft, nontender, no hepatosplenomegaly, no distention.  Neurologic: Alert and oriented x 3.  Psych: Normal affect. Extremities: No clubbing or cyanosis.   Assessment/Plan:  Atrial fibrillation Now persistent.  He was taken off amiodarone with thyroid toxicity.  Most recently, he was taken off sotalol in 5/17 with CHF.  He is symptomatically worse in atrial fibrillation and developed a CHF exacerbation requiring more Lasix. QTc today is 449 msec.  - I think that dofetilide use would be a reasonable approach.  I will get an INR today and an INR next week. If they are therapeutic, he will have > 1 month therapeutic INR and could have DCCV without TEE. I will refer him to pharmacy clinic to prepare for hospital admission for dofetilide initiation.  Would like to do this the week after next.     - Given history of LV thrombus, would use warfarin instead of NOAC.  CAD Stable with no chest pain. Continue ASA 81, lisinopril, Coreg, and statin. Hyperlipidemia  Continue atorvastatin.  Good lipids in 3/17.  Systolic CHF, chronic  NYHA class III symptoms, appears euvolemic now with increased Lasix but still does feel as good as he did prior to going into atrial fibrillation. St Jude ICD.  EF 35-40% on last echo in 4/16.  - Decrease Coreg to 18.75 bid (very fatigued after increasing to 25 bid).    - Continue current Lasix, check  BMET.  - Add back spironolactone 12.5 daily.  Hopefully with Lasix use, K will not rise significantly. BMET in 1 week.  - Continue current lisinopril.   CKD Creatinine has been stable at 1.5.  H/o LV thrombus He is on warfarin.   Followup in 1 month in office.   [name] 10/07/2015

## 2015-10-07 NOTE — Telephone Encounter (Signed)
-----   Message from Larey Dresser, MD sent at 10/06/2015  5:09 PM EDT ----- INR therapeutic, send to coumadin clinic please.

## 2015-10-07 NOTE — Telephone Encounter (Signed)
Result noted INR therapeutic, INR checked on 10/01/15 in Coumadin clinic 2.8, pt pending Tikosyn start on 10/13/15 has appt in Coumadin Clinic and with pharmacist for [place] visit, pt aware. Advised pt to continue on current Coumadin dosage 1/2 tablets daily except 1 tablet on MWF.

## 2015-10-12 ENCOUNTER — Telehealth: Payer: Self-pay | Admitting: Pharmacist

## 2015-10-12 NOTE — Telephone Encounter (Signed)
Spoke with pt, rescheduled Tikosyn admit for [id number] since precert paperwork had not been sent over and pt was not aware that he would be going to the hospital tomorrow.  Will check INR tomorrow 6/6 and again 6/12 on day of admit. Pt has been off sotalol for a month (discontinued 09/09/15) and amiodarone for 2-3 years per patient.   Reviewed patient's medication list prior to Tikosyn start and pt takes Tylenol PM every night to help him sleep. Discussed with pt that Benadryl and Benadryl-containing products will be contraindicated once he starts Tikosyn. He expressed understanding.

## 2015-10-13 ENCOUNTER — Ambulatory Visit: Payer: Medicare Other

## 2015-10-13 ENCOUNTER — Ambulatory Visit (INDEPENDENT_AMBULATORY_CARE_PROVIDER_SITE_OTHER): Payer: Medicare Other

## 2015-10-13 DIAGNOSIS — Z5181 Encounter for therapeutic drug level monitoring: Secondary | ICD-10-CM | POA: Diagnosis not present

## 2015-10-13 DIAGNOSIS — Z7901 Long term (current) use of anticoagulants: Secondary | ICD-10-CM | POA: Diagnosis not present

## 2015-10-13 DIAGNOSIS — I4891 Unspecified atrial fibrillation: Secondary | ICD-10-CM | POA: Diagnosis not present

## 2015-10-13 LAB — POCT INR: INR: 2.8

## 2015-10-13 MED ORDER — WARFARIN SODIUM 5 MG PO TABS: ORAL_TABLET | ORAL | Status: DC

## 2015-10-14 ENCOUNTER — Encounter: Payer: Self-pay | Admitting: Cardiology

## 2015-10-18 NOTE — Progress Notes (Signed)
Patient ID: [name] [name], male   DOB: December 28, 1929, 82 y.o.   MRN: [id number]     PCP: Dr. Marisue Humble Cardiology: Dr. Aundra Dubin  [name] [name] is an 82 yo M patient of Dr. Aundra Dubin who is seen today for Tikosyn initiation.  He has a PMH significant for CAD, ischemic cardiomyopathy, and atrial fibrillation.  He was seen by Dr. Aundra Dubin on 2 different occassions in May.  He complained of SOB and not feeling as well as he did in normal rhythm.  Given he now has persistent Afib and is symptomatic, plans were made to initiate Tikosyn in hopes to restore NSR.    Reviewed pt's medication list.  He is currently not taking any other antiarrhythmic medications.  He was taken off amiodarone several years ago due to thyroid toxicity.  He stopped Sotalol on 5/17 due to worsening HF.  He is not taking any other QTc prolongating or contraindicated medications.  He stopped his Tylenol PM due to benadryl being in it.  He is concerned he may need another sleep aid.  INR has been therapeutic x 4 weeks.   Discussed potential side effects with patient including QTc prolongation.  He is aware of the importance of compliance and will call the office if he misses more than 2 doses in a row.  His major complaint today is nausea.  He states this has been worsening since December.    EKG reviewed by Dr.Allred .  Atrial fibrillation with vent rate of 73 bpm.  QTc 418 msec.     Current Outpatient Prescriptions  Medication Sig Dispense Refill  . amLODipine (NORVASC) 5 MG tablet Take 1 tablet (5 mg total) by mouth 2 (two) times daily. 180 tablet 3  . aspirin EC 81 MG tablet Take 81 mg by mouth daily.    Marland Kitchen atorvastatin (LIPITOR) 20 MG tablet Take 1 tablet (20 mg total) by mouth daily. 90 tablet 3  . carvedilol (COREG) 12.5 MG tablet Take 1.5 tablets (18.75 mg total) by mouth 2 (two) times daily with a meal. 90 tablet 6  . citalopram (CELEXA) 10 MG tablet Take 10 mg by mouth at bedtime.     . furosemide (LASIX) 40 MG tablet Take 1 tablet (40  mg total) by mouth daily. 90 tablet 3  . glimepiride (AMARYL) 1 MG tablet Take 1 mg by mouth daily with breakfast.    . lisinopril (PRINIVIL,ZESTRIL) 10 MG tablet Take 1 tablet (10 mg total) by mouth daily. 90 tablet 3  . sodium chloride (OCEAN) 0.65 % nasal spray Place 1-2 sprays into the nose daily as needed for congestion. Reported on 06/05/2015    . spironolactone (ALDACTONE) 25 MG tablet Take 0.5 tablets (12.5 mg total) by mouth daily. 45 tablet 6  . warfarin (COUMADIN) 5 MG tablet TAKE AS DIRECTED BY COUMADIN CLINIC 90 tablet 1   No current facility-administered medications for this visit.    Assessment/Plan:  Atrial fibrillation- Reviewed pt's labs.  All acceptable for starting Tikosyn.  INR slightly supratherapeutic today.  SCr- 1.28.  CrCl- 66 mL/min.  Would be okay to start 521mcg BID.  Pt aware to report to hospital once bed is available.   [name] [name] 10/19/2015

## 2015-10-19 ENCOUNTER — Encounter (HOSPITAL_COMMUNITY): Payer: Self-pay

## 2015-10-19 ENCOUNTER — Ambulatory Visit (INDEPENDENT_AMBULATORY_CARE_PROVIDER_SITE_OTHER): Payer: Medicare Other | Admitting: Pharmacist

## 2015-10-19 ENCOUNTER — Inpatient Hospital Stay (HOSPITAL_COMMUNITY)
Admission: AD | Admit: 2015-10-19 | Discharge: 2015-10-22 | DRG: 309 | Disposition: A | Discharge status: Home / Self Care | Payer: Medicare Other | Source: Ambulatory Visit | Attending: Cardiovascular Disease | Admitting: Cardiovascular Disease

## 2015-10-19 DIAGNOSIS — E119 Type 2 diabetes mellitus without complications: Secondary | ICD-10-CM

## 2015-10-19 DIAGNOSIS — I252 Old myocardial infarction: Secondary | ICD-10-CM | POA: Diagnosis not present

## 2015-10-19 DIAGNOSIS — I5022 Chronic systolic (congestive) heart failure: Secondary | ICD-10-CM | POA: Diagnosis present

## 2015-10-19 DIAGNOSIS — E1122 Type 2 diabetes mellitus with diabetic chronic kidney disease: Secondary | ICD-10-CM | POA: Diagnosis present

## 2015-10-19 DIAGNOSIS — E0590 Thyrotoxicosis, unspecified without thyrotoxic crisis or storm: Secondary | ICD-10-CM | POA: Diagnosis present

## 2015-10-19 DIAGNOSIS — N183 Chronic kidney disease, stage 3 (moderate): Secondary | ICD-10-CM | POA: Diagnosis present

## 2015-10-19 DIAGNOSIS — Z5181 Encounter for therapeutic drug level monitoring: Secondary | ICD-10-CM

## 2015-10-19 DIAGNOSIS — Z8249 Family history of ischemic heart disease and other diseases of the circulatory system: Secondary | ICD-10-CM

## 2015-10-19 DIAGNOSIS — G4700 Insomnia, unspecified: Secondary | ICD-10-CM | POA: Diagnosis present

## 2015-10-19 DIAGNOSIS — Z85528 Personal history of other malignant neoplasm of kidney: Secondary | ICD-10-CM | POA: Diagnosis not present

## 2015-10-19 DIAGNOSIS — Z9079 Acquired absence of other genital organ(s): Secondary | ICD-10-CM

## 2015-10-19 DIAGNOSIS — I513 Intracardiac thrombosis, not elsewhere classified: Secondary | ICD-10-CM | POA: Diagnosis present

## 2015-10-19 DIAGNOSIS — E785 Hyperlipidemia, unspecified: Secondary | ICD-10-CM | POA: Diagnosis present

## 2015-10-19 DIAGNOSIS — Q211 Atrial septal defect: Secondary | ICD-10-CM

## 2015-10-19 DIAGNOSIS — I4891 Unspecified atrial fibrillation: Secondary | ICD-10-CM

## 2015-10-19 DIAGNOSIS — I130 Hypertensive heart and chronic kidney disease with heart failure and stage 1 through stage 4 chronic kidney disease, or unspecified chronic kidney disease: Secondary | ICD-10-CM | POA: Diagnosis present

## 2015-10-19 DIAGNOSIS — F419 Anxiety disorder, unspecified: Secondary | ICD-10-CM | POA: Diagnosis present

## 2015-10-19 DIAGNOSIS — Z7901 Long term (current) use of anticoagulants: Secondary | ICD-10-CM

## 2015-10-19 DIAGNOSIS — Z7982 Long term (current) use of aspirin: Secondary | ICD-10-CM | POA: Diagnosis not present

## 2015-10-19 DIAGNOSIS — Z8673 Personal history of transient ischemic attack (TIA), and cerebral infarction without residual deficits: Secondary | ICD-10-CM | POA: Diagnosis not present

## 2015-10-19 DIAGNOSIS — Z79899 Other long term (current) drug therapy: Secondary | ICD-10-CM

## 2015-10-19 DIAGNOSIS — Z91018 Allergy to other foods: Secondary | ICD-10-CM

## 2015-10-19 DIAGNOSIS — I255 Ischemic cardiomyopathy: Secondary | ICD-10-CM | POA: Diagnosis present

## 2015-10-19 DIAGNOSIS — I2510 Atherosclerotic heart disease of native coronary artery without angina pectoris: Secondary | ICD-10-CM | POA: Diagnosis present

## 2015-10-19 DIAGNOSIS — Z880 Allergy status to penicillin: Secondary | ICD-10-CM | POA: Diagnosis not present

## 2015-10-19 DIAGNOSIS — Z95810 Presence of automatic (implantable) cardiac defibrillator: Secondary | ICD-10-CM | POA: Diagnosis not present

## 2015-10-19 DIAGNOSIS — I4819 Other persistent atrial fibrillation: Secondary | ICD-10-CM | POA: Diagnosis present

## 2015-10-19 DIAGNOSIS — Z87891 Personal history of nicotine dependence: Secondary | ICD-10-CM

## 2015-10-19 DIAGNOSIS — I472 Ventricular tachycardia: Secondary | ICD-10-CM | POA: Diagnosis present

## 2015-10-19 DIAGNOSIS — I481 Persistent atrial fibrillation: Principal | ICD-10-CM

## 2015-10-19 DIAGNOSIS — Z8546 Personal history of malignant neoplasm of prostate: Secondary | ICD-10-CM

## 2015-10-19 DIAGNOSIS — I10 Essential (primary) hypertension: Secondary | ICD-10-CM | POA: Diagnosis not present

## 2015-10-19 DIAGNOSIS — Z8551 Personal history of malignant neoplasm of bladder: Secondary | ICD-10-CM

## 2015-10-19 DIAGNOSIS — Z905 Acquired absence of kidney: Secondary | ICD-10-CM | POA: Diagnosis not present

## 2015-10-19 HISTORY — DX: Type 2 diabetes mellitus without complications: E11.9

## 2015-10-19 HISTORY — DX: Other persistent atrial fibrillation: I48.19

## 2015-10-19 LAB — BASIC METABOLIC PANEL
ANION GAP: 6 (ref 5–15)
BUN: 20 mg/dL (ref 6–20)
CO2: 27 mmol/L (ref 22–32)
Calcium: 9.1 mg/dL (ref 8.9–10.3)
Chloride: 101 mmol/L (ref 101–111)
Creatinine, Ser: 1.28 mg/dL — ABNORMAL HIGH (ref 0.61–1.24)
GFR, EST AFRICAN AMERICAN: 58 mL/min — AB (ref >60)
GFR, EST NON AFRICAN AMERICAN: 50 mL/min — AB (ref >60)
GLUCOSE: 230 mg/dL — AB (ref 65–99)
POTASSIUM: 4.9 mmol/L (ref 3.5–5.1)
Sodium: 134 mmol/L — ABNORMAL LOW (ref 135–145)

## 2015-10-19 LAB — POCT INR: INR: 3.1

## 2015-10-19 LAB — MAGNESIUM: Magnesium: 2.2 mg/dL (ref 1.7–2.4)

## 2015-10-19 LAB — GLUCOSE, CAPILLARY: Glucose-Capillary: 191 mg/dL — ABNORMAL HIGH (ref 65–99)

## 2015-10-19 MED ORDER — AMLODIPINE BESYLATE 5 MG PO TABS
5.0000 mg | ORAL_TABLET | Freq: Two times a day (BID) | ORAL | Status: DC
  Administered 2015-10-19 – 2015-10-22 (×6): 5 mg via ORAL
  Filled 2015-10-19 (×6): qty 1

## 2015-10-19 MED ORDER — ATORVASTATIN CALCIUM 20 MG PO TABS: 20.0000 mg | ORAL_TABLET | Freq: Every day | ORAL | Status: AC

## 2015-10-19 MED ORDER — ATORVASTATIN CALCIUM 20 MG PO TABS
20.0000 mg | ORAL_TABLET | Freq: Every day | ORAL | Status: DC
  Administered 2015-10-19 – 2015-10-21 (×3): 20 mg via ORAL
  Filled 2015-10-19 (×3): qty 1

## 2015-10-19 MED ORDER — TRAZODONE HCL 50 MG PO TABS
25.0000 mg | ORAL_TABLET | Freq: Every day | ORAL | Status: DC
  Administered 2015-10-19 – 2015-10-21 (×2): 25 mg via ORAL
  Filled 2015-10-19 (×3): qty 1

## 2015-10-19 MED ORDER — CITALOPRAM HYDROBROMIDE 10 MG PO TABS
10.0000 mg | ORAL_TABLET | Freq: Every day | ORAL | Status: DC
  Administered 2015-10-19 – 2015-10-21 (×3): 10 mg via ORAL
  Filled 2015-10-19 (×3): qty 1

## 2015-10-19 MED ORDER — WARFARIN SODIUM 2.5 MG PO TABS
2.5000 mg | ORAL_TABLET | ORAL | Status: DC
  Administered 2015-10-20: 2.5 mg via ORAL
  Filled 2015-10-19: qty 1

## 2015-10-19 MED ORDER — PANTOPRAZOLE SODIUM 40 MG PO TBEC
40.0000 mg | DELAYED_RELEASE_TABLET | Freq: Two times a day (BID) | ORAL | Status: DC
  Administered 2015-10-19 – 2015-10-22 (×6): 40 mg via ORAL
  Filled 2015-10-19 (×6): qty 1

## 2015-10-19 MED ORDER — SODIUM CHLORIDE 0.9% FLUSH: 3.0000 mL | INTRAVENOUS | Status: DC | PRN

## 2015-10-19 MED ORDER — DOFETILIDE 250 MCG PO CAPS
250.0000 ug | ORAL_CAPSULE | Freq: Two times a day (BID) | ORAL | Status: DC
  Administered 2015-10-19: 250 ug via ORAL
  Filled 2015-10-19: qty 1

## 2015-10-19 MED ORDER — FUROSEMIDE 40 MG PO TABS
40.0000 mg | ORAL_TABLET | Freq: Every day | ORAL | Status: DC
  Administered 2015-10-20 – 2015-10-22 (×3): 40 mg via ORAL
  Filled 2015-10-19 (×3): qty 1

## 2015-10-19 MED ORDER — INSULIN ASPART 100 UNIT/ML ~~LOC~~ SOLN
0.0000 [IU] | Freq: Three times a day (TID) | SUBCUTANEOUS | Status: DC
  Administered 2015-10-20: 2 [IU] via SUBCUTANEOUS
  Administered 2015-10-20 – 2015-10-21 (×3): 3 [IU] via SUBCUTANEOUS
  Administered 2015-10-22: 2 [IU] via SUBCUTANEOUS
  Administered 2015-10-22: 5 [IU] via SUBCUTANEOUS

## 2015-10-19 MED ORDER — CARVEDILOL 12.5 MG PO TABS
12.5000 mg | ORAL_TABLET | Freq: Two times a day (BID) | ORAL | Status: DC
  Administered 2015-10-19 – 2015-10-22 (×6): 12.5 mg via ORAL
  Filled 2015-10-19 (×6): qty 1

## 2015-10-19 MED ORDER — WARFARIN SODIUM 5 MG PO TABS
5.0000 mg | ORAL_TABLET | ORAL | Status: DC
  Administered 2015-10-21: 5 mg via ORAL
  Filled 2015-10-19: qty 1

## 2015-10-19 MED ORDER — ONDANSETRON 4 MG PO TBDP
4.0000 mg | ORAL_TABLET | Freq: Three times a day (TID) | ORAL | Status: DC | PRN
  Filled 2015-10-19: qty 1

## 2015-10-19 MED ORDER — SPIRONOLACTONE 25 MG PO TABS
12.5000 mg | ORAL_TABLET | Freq: Every day | ORAL | Status: DC
  Administered 2015-10-19 – 2015-10-22 (×4): 12.5 mg via ORAL
  Filled 2015-10-19 (×4): qty 1

## 2015-10-19 MED ORDER — AMLODIPINE BESYLATE 5 MG PO TABS: 5.0000 mg | ORAL_TABLET | Freq: Two times a day (BID) | ORAL | Status: AC

## 2015-10-19 MED ORDER — WARFARIN - PHARMACIST DOSING INPATIENT: Freq: Every day | Status: DC

## 2015-10-19 MED ORDER — LISINOPRIL 10 MG PO TABS
10.0000 mg | ORAL_TABLET | Freq: Every day | ORAL | Status: DC
  Administered 2015-10-20 – 2015-10-22 (×3): 10 mg via ORAL
  Filled 2015-10-19 (×3): qty 1

## 2015-10-19 MED ORDER — INSULIN ASPART 100 UNIT/ML ~~LOC~~ SOLN: 0.0000 [IU] | Freq: Every day | SUBCUTANEOUS | Status: DC

## 2015-10-19 MED ORDER — SODIUM CHLORIDE 0.9 % IV SOLN: 250.0000 mL | INTRAVENOUS | Status: DC | PRN

## 2015-10-19 MED ORDER — SALINE NASAL SPRAY 0.65 % NA SOLN
1.0000 | Freq: Every day | NASAL | Status: DC | PRN
  Filled 2015-10-19: qty 30

## 2015-10-19 MED ORDER — SODIUM CHLORIDE 0.9% FLUSH
3.0000 mL | Freq: Two times a day (BID) | INTRAVENOUS | Status: DC
  Administered 2015-10-20 – 2015-10-21 (×2): 3 mL via INTRAVENOUS

## 2015-10-19 MED ORDER — ASPIRIN EC 81 MG PO TBEC: 81.0000 mg | DELAYED_RELEASE_TABLET | Freq: Every day | ORAL | Status: DC

## 2015-10-19 MED ORDER — AMLODIPINE BESYLATE 10 MG PO TABS: 10.0000 mg | ORAL_TABLET | Freq: Two times a day (BID) | ORAL | Status: DC

## 2015-10-19 MED ORDER — GLIMEPIRIDE 1 MG PO TABS
1.0000 mg | ORAL_TABLET | Freq: Every day | ORAL | Status: DC
  Administered 2015-10-20 – 2015-10-22 (×2): 1 mg via ORAL
  Filled 2015-10-19 (×2): qty 1

## 2015-10-19 NOTE — Progress Notes (Signed)
Pharmacy Review for Dofetilide (Tikosyn) Initiation  Admit Complaint: 82 y.o. male admitted 10/19/2015 with atrial fibrillation to be initiated on dofetilide.   Assessment:  Patient Exclusion Criteria: If any screening criteria checked as "Yes", then  patient  should NOT receive dofetilide until criteria item is corrected. If "Yes" please indicate correction plan.  YES  NO Patient  Exclusion Criteria Correction Plan  []  [x]  Baseline QTc interval is greater than or equal to 440 msec. IF above YES box checked dofetilide contraindicated unless patient has ICD; then may proceed if QTc 500-550 msec or with known ventricular conduction abnormalities may proceed with QTc 550-600 msec. QTc = 418   []  [x]  Magnesium level is less than 1.8 mEq/l : Last magnesium:  Lab Results  Component Value Date   MG 2.2 10/19/2015         []  [x]  Potassium level is less than 4 mEq/l : Last potassium:  Lab Results  Component Value Date   K 4.9 10/19/2015         []  [x]  Patient is known or suspected to have a digoxin level greater than 2 ng/ml: No results found for: DIGOXIN    []  [x]  Creatinine clearance less than 20 ml/min (calculated using Cockcroft-Gault, actual body weight and serum creatinine): Estimated Creatinine Clearance: 54.3 mL/min (by C-G formula based on Cr of 1.28).    []  [x]  Patient has received drugs known to prolong the QT intervals within the last 48 hours (phenothiazines, tricyclics or tetracyclic antidepressants, erythromycin, H-1 antihistamines, cisapride, fluoroquinolones, azithromycin). Drugs not listed above may have an, as yet, undetected potential to prolong the QT interval, updated information on QT prolonging agents is available at this website:QT prolonging agents   []  [x]  Patient received a dose of hydrochlorothiazide (Oretic) alone or in any combination including triamterene (Dyazide, Maxzide) in the last 48 hours.   []  [x]  Patient received a medication known to increase  dofetilide plasma concentrations prior to initial dofetilide dose:  . Trimethoprim (Primsol, Proloprim) in the last 36 hours . Verapamil (Calan, Verelan) in the last 36 hours or a sustained release dose in the last 72 hours . Megestrol (Megace) in the last 5 days  . Cimetidine (Tagamet) in the last 6 hours . Ketoconazole (Nizoral) in the last 24 hours . Itraconazole (Sporanox) in the last 48 hours  . Prochlorperazine (Compazine) in the last 36 hours    []  [x]  Patient is known to have a history of torsades de pointes; congenital or acquired long QT syndromes.   []  [x]  Patient has received a Class 1 antiarrhythmic with less than 2 half-lives since last dose. (Disopyramide, Quinidine, Procainamide, Lidocaine, Mexiletine, Flecainide, Propafenone)   []  [x]  Patient has received amiodarone therapy in the past 3 months or amiodarone level is greater than 0.3 ng/ml.    Patient has been appropriately anticoagulated with Warfarin INR 3  Ordering provider was confirmed at [email or web address] if they are not listed on the [employer] Prescribers list.  Goal of Therapy: Follow renal function, electrolytes, potential drug interactions, and dose adjustment. Provide education and 1 week supply at discharge.  Plan:  [x]   Physician selected initial dose within range recommended for patients level of renal function - will monitor for response.  []   Physician selected initial dose outside of range recommended for patients level of renal function - will discuss if the dose should be altered at this time.   Select One Calculated CrCl  Dose q12h  []  > 60 ml/min 500 mcg  [  x] 40-60 ml/min 250 mcg  []  20-40 ml/min 125 mcg   2. Follow up QTc after the first 5 doses, renal function, electrolytes (K & Mg) daily x 3     days, dose adjustment, success of initiation and facilitate 1 week discharge supply as     clinically indicated.  3. Initiate Tikosyn education video (Call [phone] and ask for video #  [id number]).  4. Place Enrollment Form on the chart for discharge supply of dofetilide.   Bonnita Nasuti Pharm.D. CPP, BCPS Clinical Pharmacist [phone] 10/19/2015 6:04 PM

## 2015-10-19 NOTE — H&P (Signed)
History and Physical   Patient ID: [name] [name] MRN: [id number], DOB/AGE: 11/23/1929 82 y.o. Date of Encounter: 10/19/2015  Primary Physician: Simona Huh, MD Primary Cardiologist: Dr Aundra Dubin, Dr Lovena Le  Chief Complaint:  Atrial fib  HPI: [name] [name] is a 82 y.o. male with a history of CAD, ischemic cardiomyopathy, and atrial fibrillation on coumadin. He has had kidney cancer, bladder cancer and prostate cancer, starting about 25 years ago.   He was previously on amiodarone, but it was stopped due to thyroid toxicity. He was on Sotalol, but had atrial fib and volume overload when seen in the office on 09/07/2015.  Lasix was increased and sotalol was stopped due to CHF.  He was seen by Dr Aundra Dubin 05/30 and the decision was made to start Tikosyn once his INR had been therapeutic for > 30 days so he could be cardioverted if necessary without a TEE.  INR has been therapeutic since 09/03/2015. QTc 449 msec.  Mr [name] [name] was seen by [name], [facility] and labs/ECG are appropriate for Tikosyn initiation. He is here for this.   Mr [name] [name] has been having problems with nausea. The symptoms have been worse for the last couple of months. He admits that he eats poorly, not a balanced diet. He had a small amount of vomiting today, that is not usual. No blood in vomitus, no black or tarry stools. No history of gastroparesis.  He rarely feels the atrial fibrillation, he noticed the afib more because he was bradycardic at baseline.   He has mild orthopnea, better if he lays on his side. Denies PND. Feels he has more DOE than usual, but says it got better after his Lasix was increased. No LE edema   Past Medical History  Diagnosis Date  . PVCs (premature ventricular contractions)   . Heart palpitations   . Coronary artery disease   . Hypertension   . Atrial fibrillation ([place])   . Anxiety     CHRONIC  . Myocardial infarction ([name] [name]) 1995    remote anterior   . Ventricular tachycardia ([place])   .  Bladder cancer ([name] [name])   . Hyperthyroidism   . Patent foramen ovale   . Mural thrombus of heart ([place])   . Genitourinary disease     CANCER  . Hyperlipidemia   . Stroke [facility]) 2006  . Left ventricular dysfunction   . Persistent atrial fibrillation ([name] [name])   . Diabetes mellitus type 2, noninsulin dependent [facility])     Surgical History:  Past Surgical History  Procedure Laterality Date  . Cardiac defibrillator placement  05/25/2005    St Jude, Dr Lovena Le  . Nephrectomy Right 1992       . Cystectomy  1992    PART OF IT REMOVED  . Cardiac catheterization    . Prostatectomy      TOTAL  . Eye surgery  2000    CATARACT BOTH EYES  . Hernia repair  1956  . Implantable cardioverter defibrillator generator change N/A 02/20/2013    Procedure: IMPLANTABLE CARDIOVERTER DEFIBRILLATOR GENERATOR CHANGE;  Surgeon: Evans Lance, MD;  Location: [facility] CATH LAB;  Service: Cardiovascular;  St Jude ICD, single chamber     I have reviewed the patient's current medications. Prior to Admission medications   Medication Sig Start Date End Date Taking? Authorizing Provider  amLODipine (NORVASC) 5 MG tablet Take 1 tablet (5 mg total) by mouth 2 (two) times daily. 10/19/15   Larey Dresser, MD  aspirin  EC 81 MG tablet Take 81 mg by mouth daily.    Historical Provider, MD  atorvastatin (LIPITOR) 20 MG tablet Take 1 tablet (20 mg total) by mouth daily. 10/19/15   Larey Dresser, MD  carvedilol (COREG) 12.5 MG tablet Take 1.5 tablets (18.75 mg total) by mouth 2 (two) times daily with a meal. 10/06/15   Larey Dresser, MD  citalopram (CELEXA) 10 MG tablet Take 10 mg by mouth at bedtime.     Historical Provider, MD  furosemide (LASIX) 40 MG tablet Take 1 tablet (40 mg total) by mouth daily. 09/09/15   Fay Records, MD  glimepiride (AMARYL) 1 MG tablet Take 1 mg by mouth daily with breakfast.    Historical Provider, MD  lisinopril (PRINIVIL,ZESTRIL) 10 MG tablet Take 1 tablet (10 mg total) by mouth daily. 04/10/15   Brittainy  Erie Noe, PA-C  sodium chloride (OCEAN) 0.65 % nasal spray Place 1-2 sprays into the nose daily as needed for congestion. Reported on 06/05/2015    Historical Provider, MD  spironolactone (ALDACTONE) 25 MG tablet Take 0.5 tablets (12.5 mg total) by mouth daily. 10/06/15   Larey Dresser, MD  warfarin (COUMADIN) 5 MG tablet TAKE AS DIRECTED BY COUMADIN CLINIC 10/13/15   Larey Dresser, MD   Scheduled Meds: Continuous Infusions: PRN Meds:.  Allergies:  Allergies  Allergen Reactions  . Penicillins Swelling and Anaphylaxis  . Tape Other (See Comments)    Itch/break out/redness. Paper tape OK    Social History   Social History  . Marital Status: Married    Spouse Name: N/A  . Number of Children: N/A  . Years of Education: N/A   Occupational History  . Retired    Social History Main Topics  . Smoking status: Former Smoker    Quit date: 05/09/1990  . Smokeless tobacco: Never Used  . Alcohol Use: No  . Drug Use: No  . Sexual Activity: No   Other Topics Concern  . Not on file   Social History Narrative   Pt lives alone since his wife died 08-14-2010.    Family History  Problem Relation Age of Onset  . Heart attack Father   . Hypertension Mother   . Stroke Sister   . Stroke Paternal Grandfather   . Heart attack Maternal Grandmother    Family Status  Relation Status Death Age  . Father Deceased   . Mother Deceased   . Maternal Grandmother Deceased   . Maternal Grandfather Deceased   . Paternal Grandmother Deceased   . Paternal Grandfather Deceased     Review of Systems:   Full 14-point review of systems otherwise negative except as noted above.  Physical Exam: Blood pressure 157/92, pulse 90, temperature 98.2 F (36.8 C), temperature source Oral, resp. rate 18, height 6\' 1"  (1.854 m), weight 228 lb 6.3 oz (103.6 kg), SpO2 99 %. General: Well developed, well nourished,male in no acute distress. Head: Normocephalic, atraumatic, sclera non-icteric, no xanthomas, nares are  without discharge. Dentition: poor Neck: No carotid bruits. JVD not elevated. No thyromegally Lungs: Good expansion bilaterally. without wheezes or rhonchi.  Heart: IRRegular rate and rhythm with S1 S2.  No S3 or S4. Soft murmur, no rubs, or gallops appreciated. Abdomen: Soft, non-tender, non-distended with normoactive bowel sounds. No hepatomegaly. No rebound/guarding. No obvious abdominal masses. Msk:  Strength and tone appear normal for age. No joint deformities or effusions, no spine or costo-vertebral angle tenderness. Extremities: No clubbing or cyanosis. No edema.  Distal pedal pulses are 2+ in 4 extrem Neuro: Alert and oriented X 3. Moves all extremities spontaneously. No focal deficits noted. Psych:  Responds to questions appropriately with a normal affect. Skin: No rashes or lesions noted  Labs:   Lab Results  Component Value Date   WBC 9.1 07/21/2015   HGB 15.4 07/21/2015   HCT 45.3 07/21/2015   MCV 90.4 07/21/2015   PLT 191 07/21/2015    Recent Labs  10/19/15 0823  INR 3.1     Recent Labs Lab 10/19/15 0930  NA 134*  K 4.9  CL 101  CO2 27  BUN 20  CREATININE 1.28*  CALCIUM 9.1  GLUCOSE 230*   Lab Results  Component Value Date   CHOL 125 07/21/2015   HDL 32* 07/21/2015   LDLCALC 71 07/21/2015   TRIG 110 07/21/2015   B NATRIURETIC PEPTIDE  Date/Time Value Ref Range Status  09/07/2015 03:28 PM 333.7* 0.0 - 100.0 pg/mL Final   ECG: pending  ASSESSMENT AND PLAN:  Principal Problem:   Persistent atrial fibrillation (HCC) - start Tikosyn, per guidelines - follow labs and ECG - schedule DCCV for Weds or Thurs if does not convert spontaneously.  Active Problems:   Hypertension - continue home rx - BP may be elevated due to anxiety, but will increase Norvasc 5 mg>>10 mg qd    Hyperthyroidism -  No recent TSH, ck in am    Warfarin anticoagulation - CHADS2VASC=8 (age x 2, CHF, CAD, CVA=2, DM, HTN) This patients CHA2DS2-VASc Score and unadjusted  Ischemic Stroke Rate (% per year) is equal to 10.8 % stroke rate/year from a score of 8     Systolic CHF, chronic (HCC) - volume status good by exam - weight is below recent weights - continue current Lasix dose    CKD III - Follow, Cr is below baseline    Nausea - concern for gastroparesis or GERD causing sx - add BID Protonix and follow, may need Reglan     Insomnia - pt now off Tylenol PM, has not slept recently - add low-dose Trazodone    Diabetes, type II  - not on meds at home - ADA diet, SSI - ck A1c   Signed, Lenoard Aden 10/19/2015 5:57 PM Beeper [id number]  Attending Note:   The patient was seen and examined.  Agree with assessment and plan as noted above.  Changes made to the above note as needed.  Patient seen and independently examined with Rosaria Ferries, PA.   We discussed all aspects of the encounter. I agree with the assessment and plan as stated above.  Pt has had fatigue - quite likely due to atrial fib. Has not tolerated other antiarrhythmics.  Is now admitted for tikosyn loading. Will be seeing Bonnita Nasuti , Pharm D.  He was a DNR at home. We discussed the issue that we would like to resend this order temporarily so that we have the option of doing a defibrillation if needed during this Tikosyn loading . He agrees with the plan to be a full code during this time    I have spent a total of 40 minutes with patient reviewing hospital  notes , telemetry, EKGs, labs and examining patient as well as establishing an assessment and plan that was discussed with the patient. > 50% of time was spent in direct patient care.   Thayer Headings, Brooke Bonito., MD, [facility] 10/19/2015, 6:09 PM [address]  [place] [phone] Pager  [phone]      

## 2015-10-19 NOTE — Progress Notes (Signed)
ANTICOAGULATION CONSULT NOTE - Initial Consult  Pharmacy Consult for warfarin Indication: atrial fibrillation  Allergies  Allergen Reactions  . Penicillins Swelling and Anaphylaxis  . Tape Other (See Comments)    Itch/break out/redness. Paper tape OK    Patient Measurements: Height: 6\' 1"  (185.4 cm) Weight: 228 lb 6.3 oz (103.6 kg) IBW/kg (Calculated) : 79.9   Vital Signs: Temp: 98.2 F (36.8 C) (06/12 1647) Temp Source: Oral (06/12 1647) BP: 157/92 mmHg (06/12 1647) Pulse Rate: 90 (06/12 1647)  Labs:  Recent Labs  10/19/15 0823 10/19/15 0930  INR 3.1  --   CREATININE  --  1.28*    Estimated Creatinine Clearance: 54.3 mL/min (by C-G formula based on Cr of 1.28).   Medical History: Past Medical History  Diagnosis Date  . PVCs (premature ventricular contractions)   . Heart palpitations   . Coronary artery disease   . Hypertension   . Atrial fibrillation ([place])   . Anxiety     CHRONIC  . Myocardial infarction ([place]) 1995    remote anterior   . Ventricular tachycardia ([place])   . Bladder cancer ([place])   . Hyperthyroidism   . Patent foramen ovale   . Mural thrombus of heart ([place])   . Genitourinary disease     CANCER  . Hyperlipidemia   . Stroke [facility]) 2006  . Left ventricular dysfunction   . Persistent atrial fibrillation ([place])   . Diabetes mellitus type 2, noninsulin dependent [facility])      Assessment: 86yom admitted for Tikosyn load for cardioversion for Afib.  Anticoagulated with warfarin INR on admission 3.  No bleeding noted.  Continue current home dose warfarin.   Goal of Therapy:  INR 2-3 Monitor platelets by anticoagulation protocol: Yes   Plan:  Continue home dose warfarin 5mg  MWF, 2.5mg  TTSS Daily INR  Monitor for bleeding   Bonnita Nasuti Pharm.D. CPP, BCPS Clinical Pharmacist [phone] 10/19/2015 6:37 PM

## 2015-10-20 DIAGNOSIS — E0590 Thyrotoxicosis, unspecified without thyrotoxic crisis or storm: Secondary | ICD-10-CM

## 2015-10-20 DIAGNOSIS — E119 Type 2 diabetes mellitus without complications: Secondary | ICD-10-CM

## 2015-10-20 LAB — BASIC METABOLIC PANEL
Anion gap: 8 (ref 5–15)
BUN: 18 mg/dL (ref 6–20)
CALCIUM: 9.4 mg/dL (ref 8.9–10.3)
CO2: 29 mmol/L (ref 22–32)
Chloride: 99 mmol/L — ABNORMAL LOW (ref 101–111)
Creatinine, Ser: 1.15 mg/dL (ref 0.61–1.24)
GFR calc Af Amer: >60 mL/min (ref >60)
GFR, EST NON AFRICAN AMERICAN: 57 mL/min — AB (ref >60)
GLUCOSE: 139 mg/dL — AB (ref 65–99)
Potassium: 4.4 mmol/L (ref 3.5–5.1)
Sodium: 136 mmol/L (ref 135–145)

## 2015-10-20 LAB — PROTIME-INR
INR: 2.86 — AB (ref 0.00–1.49)
Prothrombin Time: 29.5 seconds — ABNORMAL HIGH (ref 11.6–15.2)

## 2015-10-20 LAB — GLUCOSE, CAPILLARY
GLUCOSE-CAPILLARY: 160 mg/dL — AB (ref 65–99)
GLUCOSE-CAPILLARY: 131 mg/dL — AB (ref 65–99)
GLUCOSE-CAPILLARY: 238 mg/dL — AB (ref 65–99)
Glucose-Capillary: 126 mg/dL — ABNORMAL HIGH (ref 65–99)

## 2015-10-20 LAB — MAGNESIUM: MAGNESIUM: 2.2 mg/dL (ref 1.7–2.4)

## 2015-10-20 LAB — HEMOGLOBIN A1C
Hgb A1c MFr Bld: 8 % — ABNORMAL HIGH (ref 4.8–5.6)
Mean Plasma Glucose: 183 mg/dL

## 2015-10-20 MED ORDER — DOFETILIDE 500 MCG PO CAPS
500.0000 ug | ORAL_CAPSULE | Freq: Two times a day (BID) | ORAL | Status: DC
  Administered 2015-10-20 – 2015-10-22 (×5): 500 ug via ORAL
  Filled 2015-10-20 (×5): qty 1

## 2015-10-20 MED ORDER — ACETAMINOPHEN 325 MG PO TABS
650.0000 mg | ORAL_TABLET | Freq: Four times a day (QID) | ORAL | Status: DC | PRN
  Administered 2015-10-20 – 2015-10-22 (×7): 650 mg via ORAL
  Filled 2015-10-20 (×7): qty 2

## 2015-10-20 NOTE — Care Management Important Message (Signed)
Important Message  Patient Details  Name: [name] [name] MRN: [id number] Date of Birth: 06-04-1929   Medicare Important Message Given:  Yes    Loann Quill 10/20/2015, 8:45 AM

## 2015-10-20 NOTE — Progress Notes (Addendum)
   SUBJECTIVE: The patient is doing well today.  At this time, he denies chest pain, shortness of breath, or any new concerns.  Marland Kitchen amLODipine  5 mg Oral BID  . atorvastatin  20 mg Oral q1800  . carvedilol  12.5 mg Oral BID WC  . citalopram  10 mg Oral QHS  . dofetilide  250 mcg Oral BID  . furosemide  40 mg Oral Daily  . glimepiride  1 mg Oral Q breakfast  . insulin aspart  0-15 Units Subcutaneous TID WC  . insulin aspart  0-5 Units Subcutaneous QHS  . lisinopril  10 mg Oral Daily  . pantoprazole  40 mg Oral BID  . sodium chloride flush  3 mL Intravenous Q12H  . spironolactone  12.5 mg Oral Daily  . traZODone  25 mg Oral QHS  . warfarin  2.5 mg Oral Q T,Th,S,Su-1800  . [START ON 10/21/2015] warfarin  5 mg Oral Q M,W,F-1800  . Warfarin - Pharmacist Dosing Inpatient   Does not apply q1800      OBJECTIVE: Physical Exam: Filed Vitals:   10/19/15 1647 10/19/15 2152 10/20/15 0504  BP: 157/92 135/87 116/57  Pulse: 90 83 74  Temp: 98.2 F (36.8 C) 98.2 F (36.8 C) 98 F (36.7 C)  TempSrc: Oral Oral Oral  Resp: 18    Height: 6\' 1"  (1.854 m)    Weight: 228 lb 6.3 oz (103.6 kg)  224 lb (101.606 kg)  SpO2: 99% 97% 96%    Intake/Output Summary (Last 24 hours) at 10/20/15 0743 Last data filed at 10/20/15 0515  Gross per 24 hour  Intake    240 ml  Output    875 ml  Net   -635 ml    Telemetry reveals rate controlled afib  GEN- The patient is well appearing, alert and oriented x 3 today.   Head- normocephalic, atraumatic Eyes-  Sclera clear, conjunctiva pink Ears- hearing intact Oropharynx- clear Neck- supple,   Lungs- Clear to ausculation bilaterally, normal work of breathing Heart- irregular rate and rhythm  GI- soft, NT, ND, + BS Extremities- no clubbing, cyanosis, or edema Skin- no rash or lesion Psych- euthymic mood, full affect Neuro- strength and sensation are intact  LABS: Basic Metabolic Panel:  Recent Labs  10/19/15 0930 10/20/15 0409  NA 134* 136  K  4.9 4.4  CL 101 99*  CO2 27 29  GLUCOSE 230* 139*  BUN 20 18  CREATININE 1.28* 1.15  CALCIUM 9.1 9.4  MG 2.2 2.2   Hemoglobin A1C:  Recent Labs  10/19/15 1846  HGBA1C 8.0*   ASSESSMENT AND PLAN:   Hypertension Stable No change required today  Hyperthyroidism Check TSH  Persistent afib Qt is stable on tikosyn Therapeutic INR Will require cardioversion tomorrow if still in af  Systolic CHF, chronic (HCC) euvolemic - continue current Lasix dose   CKD III - Follow, Cr is below baseline As per Elberta Leatherwood, CrCl 66.  Will therefore increase tikosyn to 500 mcg BID and follow closely.  Diabetes, type II  - not on meds at home - ADA diet, SSI    Thompson Grayer, MD 10/20/2015 7:43 AM

## 2015-10-20 NOTE — Progress Notes (Signed)
Rosaria Ferries, PA with cardiology on floor and made aware of pt's concerns regarding cost of Tikosyn at discharge. Suanne Marker stated she would notify EP and have them follow up.

## 2015-10-20 NOTE — Progress Notes (Signed)
ANTICOAGULATION CONSULT NOTE - Initial Consult  Pharmacy Consult for warfarin Indication: atrial fibrillation  Allergies  Allergen Reactions  . Penicillins Swelling and Anaphylaxis  . Tape Other (See Comments)    Itch/break out/redness. Paper tape OK    Patient Measurements: Height: 6\' 1"  (185.4 cm) Weight: 224 lb (101.606 kg) IBW/kg (Calculated) : 79.9   Vital Signs: Temp: 98.3 F (36.8 C) (06/13 1342) Temp Source: Oral (06/13 1342) BP: 144/74 mmHg (06/13 1342) Pulse Rate: 79 (06/13 1342)  Labs:  Recent Labs  10/19/15 0823 10/19/15 0930 10/20/15 0409  LABPROT  --   --  29.5*  INR 3.1  --  2.86*  CREATININE  --  1.28* 1.15    Estimated Creatinine Clearance: 59.9 mL/min (by C-G formula based on Cr of 1.15).  Assessment: 86yom admitted for Tikosyn load for cardioversion for Afib.  Anticoagulated with warfarin INR 2.86 today.  No bleeding noted.  PTA dose: 2.5 mg TTSS, 5mg  MWF  Mg 2.2, K 4.4, scr 1.15, est. crcl ~ 60 ml/min, QTc 440  Goal of Therapy:  INR 2-3 Monitor platelets by anticoagulation protocol: Yes    Plan:  Continue home dose warfarin 5mg  MWF, 2.5mg  TTSS Daily INR  Monitor for bleeding  Continue tikosyn 500 mg BID as ordered   Maryanna Shape, PharmD, BCPS  Clinical Pharmacist  Pager: [phone]  10/20/2015 1:51 PM

## 2015-10-20 NOTE — Progress Notes (Signed)
Discussed with the patient concerns regarding Tikosyn cost, at this time, he wants to pursue potential help/cost reduction plans the drug company may have and continue with the Tikosyn load and planned DCCV for tomorrow.  With the intention of continuing the medication out patient despite current cost with his insurance plan.  Tommye Standard, PA-C

## 2015-10-20 NOTE — Care Management Note (Addendum)
Case Management Note  Patient Details  Name: [name] [name] MRN: [id number] Date of Birth: [birth year]  Subjective/Objective:   Pt admitted for Tikosyn Load. Benefits check completed. Will make pt aware of cost. CM will assist with 7 day Rx via main pharmacy. Pt will then need Original Rx with refills. [facility] did call [facility] and pharmacy is not on the [employer]. NCM will [facility] [address] to see if medication can be ordered.  Price for 90 day supply will be:  No further needs from CM at this time.                   Action/Plan:S/W HORACE @ [employer] DISCOUNT RX # [id number]   TIKOSYN CAPSULE 500 MCG BID (30 )   COVER- YES  CO-PAY- $612.04  PRIOR APPROVAL NO  PHARMACY- ANY RETAIL   DOFETILIDE 500 MC G BID ( 30 )   COVER- YES  CO-PAY- $196.47  PRIOR APPROVAL - NO  PHARMACY : ANY RETAIL   Expected Discharge Date:                  Expected Discharge Plan:  Home/Self Care  In-House Referral:  NA  Discharge planning Services  CM Consult, Medication Assistance  Post Acute Care Choice:  NA Choice offered to:  NA  DME Arranged:  N/A DME Agency:  NA  HH Arranged:  NA HH Agency:  NA  Status of Service:  Completed, signed off  Medicare Important Message Given:  Yes Date Medicare IM Given:    Medicare IM give by:    Date Additional Medicare IM Given:    Additional Medicare Important Message give by:     If discussed at [place] of Stay Meetings, dates discussed:    Additional Comments: Per pt he will like to get medications from [facility]. 30 day free cost will be 143. 09. 90 day supply price will be 431.00. Pt states price is expensive and he feels he will have a hardship paying for medications. CM did provide pt with patient assistance form placed in shadow chart. MD will need to fill out and staff RN to provide to pt before d/c. Pt states he was on Sotalol prior to admission and was taken off the medication without increase in the medication. Pt stated  office did not make him aware of the cost of Tikosyn as well. Concerns expressed were relayed to Staff RN Cyril Mourning. Pt will need to speak with cardiology in ref to medication regimen. No further needs from CM at this time.   Bethena Roys, RN 10/20/2015, 12:26 PM

## 2015-10-20 NOTE — Discharge Instructions (Signed)

## 2015-10-21 ENCOUNTER — Encounter (HOSPITAL_COMMUNITY): Payer: Self-pay | Admitting: *Deleted

## 2015-10-21 ENCOUNTER — Encounter (HOSPITAL_COMMUNITY)
Admission: AD | Disposition: A | Discharge status: Home / Self Care | Payer: Self-pay | Source: Ambulatory Visit | Attending: Cardiovascular Disease

## 2015-10-21 DIAGNOSIS — I5022 Chronic systolic (congestive) heart failure: Secondary | ICD-10-CM

## 2015-10-21 LAB — TSH: TSH: 2.37 u[IU]/mL (ref 0.350–4.500)

## 2015-10-21 LAB — PROTIME-INR
INR: 2.5 — AB (ref 0.00–1.49)
Prothrombin Time: 26.7 seconds — ABNORMAL HIGH (ref 11.6–15.2)

## 2015-10-21 LAB — MAGNESIUM: Magnesium: 2.1 mg/dL (ref 1.7–2.4)

## 2015-10-21 LAB — BASIC METABOLIC PANEL
ANION GAP: 6 (ref 5–15)
BUN: 24 mg/dL — ABNORMAL HIGH (ref 6–20)
CALCIUM: 9.1 mg/dL (ref 8.9–10.3)
CHLORIDE: 102 mmol/L (ref 101–111)
CO2: 26 mmol/L (ref 22–32)
Creatinine, Ser: 1.38 mg/dL — ABNORMAL HIGH (ref 0.61–1.24)
GFR calc non Af Amer: 45 mL/min — ABNORMAL LOW (ref >60)
GFR, EST AFRICAN AMERICAN: 53 mL/min — AB (ref >60)
Glucose, Bld: 149 mg/dL — ABNORMAL HIGH (ref 65–99)
Potassium: 4.1 mmol/L (ref 3.5–5.1)
Sodium: 134 mmol/L — ABNORMAL LOW (ref 135–145)

## 2015-10-21 LAB — GLUCOSE, CAPILLARY
GLUCOSE-CAPILLARY: 191 mg/dL — AB (ref 65–99)
GLUCOSE-CAPILLARY: 103 mg/dL — AB (ref 65–99)
Glucose-Capillary: 152 mg/dL — ABNORMAL HIGH (ref 65–99)
Glucose-Capillary: 119 mg/dL — ABNORMAL HIGH (ref 65–99)

## 2015-10-21 SURGERY — CANCELLED PROCEDURE

## 2015-10-21 MED ORDER — SODIUM CHLORIDE 0.9 % IV SOLN
250.0000 mL | INTRAVENOUS | Status: DC
  Administered 2015-10-21: 12:00:00 via INTRAVENOUS

## 2015-10-21 MED ORDER — MAGNESIUM HYDROXIDE 400 MG/5ML PO SUSP: 30.0000 mL | Freq: Every day | ORAL | Status: DC | PRN

## 2015-10-21 MED ORDER — SODIUM CHLORIDE 0.9% FLUSH
3.0000 mL | Freq: Two times a day (BID) | INTRAVENOUS | Status: DC
  Administered 2015-10-21 – 2015-10-22 (×2): 3 mL via INTRAVENOUS

## 2015-10-21 MED ORDER — DOCUSATE SODIUM 100 MG PO CAPS
100.0000 mg | ORAL_CAPSULE | Freq: Two times a day (BID) | ORAL | Status: DC
  Administered 2015-10-21 – 2015-10-22 (×2): 100 mg via ORAL
  Filled 2015-10-21 (×2): qty 1

## 2015-10-21 MED ORDER — BISACODYL 10 MG RE SUPP: 10.0000 mg | Freq: Every day | RECTAL | Status: DC | PRN

## 2015-10-21 MED ORDER — SODIUM CHLORIDE 0.9% FLUSH: 3.0000 mL | INTRAVENOUS | Status: DC | PRN

## 2015-10-21 MED ORDER — HYDROCORTISONE 1 % EX CREA
1.0000 "application " | TOPICAL_CREAM | Freq: Three times a day (TID) | CUTANEOUS | Status: DC | PRN
  Filled 2015-10-21: qty 28

## 2015-10-21 NOTE — Progress Notes (Signed)
SUBJECTIVE: The patient is doing well today.  At this time, he denies chest pain, shortness of breath, or any new concerns.  Marland Kitchen amLODipine  5 mg Oral BID  . atorvastatin  20 mg Oral q1800  . carvedilol  12.5 mg Oral BID WC  . citalopram  10 mg Oral QHS  . dofetilide  500 mcg Oral BID  . furosemide  40 mg Oral Daily  . glimepiride  1 mg Oral Q breakfast  . insulin aspart  0-15 Units Subcutaneous TID WC  . insulin aspart  0-5 Units Subcutaneous QHS  . lisinopril  10 mg Oral Daily  . pantoprazole  40 mg Oral BID  . sodium chloride flush  3 mL Intravenous Q12H  . sodium chloride flush  3 mL Intravenous Q12H  . spironolactone  12.5 mg Oral Daily  . traZODone  25 mg Oral QHS  . warfarin  2.5 mg Oral Q T,Th,S,Su-1800  . warfarin  5 mg Oral Q M,W,F-1800  . Warfarin - Pharmacist Dosing Inpatient   Does not apply q1800   . sodium chloride      OBJECTIVE: Physical Exam: Filed Vitals:   10/20/15 0800 10/20/15 1342 10/20/15 2006 10/21/15 0500  BP: 124/62 144/74 105/55 130/73  Pulse:  79 80 77  Temp:  98.3 F (36.8 C) 98.5 F (36.9 C) 97.8 F (36.6 C)  TempSrc:  Oral Oral Oral  Resp:  18 18 77  Height:      Weight:    224 lb (101.606 kg)  SpO2:  97% 97% 99%    Intake/Output Summary (Last 24 hours) at 10/21/15 0801 Last data filed at 10/21/15 0500  Gross per 24 hour  Intake   1160 ml  Output   1250 ml  Net    -90 ml    Telemetry remains in rate controlled afib, generally 70's  GEN- The patient is well appearing, alert and oriented x 3 today.   Head- normocephalic, atraumatic Eyes-  Sclera clear, conjunctiva pink Ears- hearing intact Oropharynx- clear Neck- supple,   Lungs- Clear to ausculation bilaterally, normal work of breathing Heart- irregular rate and rhythm  GI- soft, NT, ND Extremities- no clubbing, cyanosis, or edema, chronic skin changes Skin- no rash or lesion Psych- euthymic mood, full affect Neuro- strength and sensation are intact  LABS: Basic  Metabolic Panel:  Recent Labs  10/20/15 0409 10/21/15 0446  NA 136 134*  K 4.4 4.1  CL 99* 102  CO2 29 26  GLUCOSE 139* 149*  BUN 18 24*  CREATININE 1.15 1.38*  CALCIUM 9.4 9.1  MG 2.2 2.1   Hemoglobin A1C:  Recent Labs  10/19/15 1846  HGBA1C 8.0*   ASSESSMENT AND PLAN:   Hypertension Stable No change required today  Hyperthyroidism TSH is wnl, 2.37  Persistent afib For Tikosyn load Qt is stable s/p 3rd dose Therapeutic INR, 2.50 today K+ 4.1 Mag 2.1 DCCV for 1PM today if not in SR  Systolic CHF, chronic (HCC) euvolemic - continue current Lasix dose   CKD III 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 Cl 57.27), will follow, d/w Dr. Rayann Heman continue 517mcg dose today  Diabetes, type II  - not on meds at home - ADA diet, SSI    Renee Leighton Parody 10/21/2015 8:01 AM   I have seen, examined the patient, and reviewed the above assessment and plan.  QT is stable.  Plans for cardioversion  today discussed.  On exam, iRRR.  Changes to above are made where necessary.   Anticipate discharge to home tomorrow.  Co Sign: Thompson Grayer, MD 10/21/2015 8:59 AM

## 2015-10-21 NOTE — Progress Notes (Signed)
ANTICOAGULATION CONSULT NOTE - Initial Consult  Pharmacy Consult for warfarin Indication: atrial fibrillation  Allergies  Allergen Reactions  . Penicillins Swelling and Anaphylaxis  . Tape Other (See Comments)    Itch/break out/redness. Paper tape OK    Patient Measurements: Height: 6\' 1"  (185.4 cm) Weight: 224 lb (101.606 kg) IBW/kg (Calculated) : 79.9   Vital Signs: Temp: 97.8 F (36.6 C) (06/14 0500) Temp Source: Oral (06/14 0500) BP: 140/71 mmHg (06/14 0936) Pulse Rate: 77 (06/14 0500)  Labs:  Recent Labs  10/19/15 0823 10/19/15 0930 10/20/15 0409 10/21/15 0446  LABPROT  --   --  29.5* 26.7*  INR 3.1  --  2.86* 2.50*  CREATININE  --  1.28* 1.15 1.38*    Estimated Creatinine Clearance: 49.9 mL/min (by C-G formula based on Cr of 1.38).  Assessment: 86yom admitted for Tikosyn load for cardioversion for Afib.  Anticoagulated with warfarin INR 2.5 today.  No bleeding noted.  PTA dose: 2.5 mg TTSS, 5mg  MWF  Mg 2.1, K 4.1, scr 1.15 > 1.38, est. crcl ~ 50 ml/min, QTc 440 > 478  Goal of Therapy:  INR 2-3 Monitor platelets by anticoagulation protocol: Yes    Plan:  Continue home dose warfarin 5mg  MWF, 2.5mg  TTSS Daily INR  Monitor for bleeding  Continue tikosyn 500 mg BID as ordered monitor renal function  Maryanna Shape, PharmD, BCPS  Clinical Pharmacist  Pager: [phone]  10/21/2015 11:20 AM

## 2015-10-21 NOTE — Progress Notes (Signed)
Pt in sinus rhythm confirmed by 12 lead EKG and reviewed by Dr. Oval Linsey, brt, rn

## 2015-10-22 ENCOUNTER — Other Ambulatory Visit: Payer: Self-pay | Admitting: Physician Assistant

## 2015-10-22 DIAGNOSIS — N183 Chronic kidney disease, stage 3 (moderate): Secondary | ICD-10-CM

## 2015-10-22 LAB — BASIC METABOLIC PANEL
Anion gap: 10 (ref 5–15)
BUN: 30 mg/dL — AB (ref 6–20)
CALCIUM: 9 mg/dL (ref 8.9–10.3)
CO2: 26 mmol/L (ref 22–32)
Chloride: 98 mmol/L — ABNORMAL LOW (ref 101–111)
Creatinine, Ser: 1.64 mg/dL — ABNORMAL HIGH (ref 0.61–1.24)
GFR calc Af Amer: 43 mL/min — ABNORMAL LOW (ref >60)
GFR, EST NON AFRICAN AMERICAN: 37 mL/min — AB (ref >60)
GLUCOSE: 118 mg/dL — AB (ref 65–99)
POTASSIUM: 4.5 mmol/L (ref 3.5–5.1)
Sodium: 134 mmol/L — ABNORMAL LOW (ref 135–145)

## 2015-10-22 LAB — PROTIME-INR
INR: 2.25 — ABNORMAL HIGH (ref 0.00–1.49)
Prothrombin Time: 24.6 seconds — ABNORMAL HIGH (ref 11.6–15.2)

## 2015-10-22 LAB — GLUCOSE, CAPILLARY
Glucose-Capillary: 134 mg/dL — ABNORMAL HIGH (ref 65–99)
Glucose-Capillary: 211 mg/dL — ABNORMAL HIGH (ref 65–99)

## 2015-10-22 LAB — MAGNESIUM: MAGNESIUM: 2.2 mg/dL (ref 1.7–2.4)

## 2015-10-22 MED ORDER — DOFETILIDE 500 MCG PO CAPS
500.0000 ug | ORAL_CAPSULE | Freq: Two times a day (BID) | ORAL | Status: DC
Start: 2015-10-22 — End: 2015-10-22

## 2015-10-22 MED ORDER — WARFARIN SODIUM 5 MG PO TABS: ORAL_TABLET | ORAL | Status: DC

## 2015-10-22 MED ORDER — FUROSEMIDE 20 MG PO TABS: 20.0000 mg | ORAL_TABLET | Freq: Every day | ORAL | Status: DC

## 2015-10-22 MED ORDER — DOFETILIDE 500 MCG PO CAPS: 500.0000 ug | ORAL_CAPSULE | Freq: Two times a day (BID) | ORAL | Status: AC

## 2015-10-22 NOTE — Discharge Summary (Signed)
ELECTROPHYSIOLOGY PROCEDURE DISCHARGE SUMMARY    Patient ID: [name] [name],  MRN: [id number], DOB/AGE: October 21, 1929 82 y.o.  Admit date: 10/19/2015 Discharge date: 10/22/2015  Primary Care Physician: Simona Huh, MD Primary Cardiologist: Dr. Aundra Dubin Electrophysiologist: Dr. Lovena Le  Primary Discharge Diagnosis:  1.  Persistent atrial fibrillation status post Tikosyn loading this admission      CHA2DS2Vasc is at least 6 on coumadin      Next lab/coumadin clinic visit 11/02/15  Secondary Discharge Diagnosis:  1. CAD 2. ICM/ICD  3. CRI stage III     Single kidney 4. HTN 5. DM  Allergies  Allergen Reactions  . Penicillins Swelling and Anaphylaxis  . Tape Other (See Comments)    Itch/break out/redness. Paper tape OK     Procedures This Admission:  1.  Tikosyn loading   Brief HPI: [name] [name] is a 82 y.o. male with a past medical history as noted above.  They were referred to EP in the outpatient setting for treatment options of atrial fibrillation.  Risks, benefits, and alternatives to Tikosyn were reviewed with the patient who wished to proceed.    Hospital Course:  The patient was admitted and Tikosyn was initiated.  Renal function and electrolytes were followed during the hospitalization. His renal function waxes/wanes, calculated Creat Clearance was >60 on arrival, today 47, historically creatinine appears 1.2-1.5. Dr. Rayann Heman discussed with the patient and his son at bedside, will skip lasix tomorrow, and decrease his Lasix to 20mg  daily and repeat BMET on Monday. Their QTc remained stable. INR remained therapeutic.  He converted to sinus rhythm 10/21/15 without the need for DCCV.  They were monitored until discharge on telemetry which demonstrated SR.  On the day of discharge, they were examined by Dr Rayann Heman who considered them stable for discharge to home.  Follow-up has been arranged for lab draw on Monday 6/19, with AFib clinic in 1 week and with Dr Lovena Le in 4  weeks, he will continue his f/u with Dr. Aundra Dubin as well. There was financial concerns with Tikosyn, the patient states he will pay for the medicine getting started and apply for cost reduction/help from the drug company in effort to make it more affordable for the long term.  The patient has been instructed to follow up with his PMD regarding his DM control/management as well.   Physical Exam: Filed Vitals:   10/21/15 1157 10/21/15 1345 10/21/15 2159 10/22/15 0507  BP: 151/73 136/75 131/64 130/64  Pulse:  81 65 66  Temp: 97.8 F (36.6 C) 98 F (36.7 C) 97.9 F (36.6 C) 97.7 F (36.5 C)  TempSrc: Oral Oral Oral Oral  Resp: 15 18 11 18   Height:      Weight:    223 lb 4.8 oz (101.288 kg)  SpO2: 98% 99% 96% 95%    GEN- The patient is well appearing, alert and oriented x 3 today.   HEENT: normocephalic, atraumatic; sclera clear, conjunctiva pink; hearing intact; oropharynx clear; neck supple, no JVP Lymph- no cervical lymphadenopathy Lungs- Clear to ausculation bilaterally, normal work of breathing.  No wheezes, rales, rhonchi Heart- Regular rate and rhythm, no murmurs, rubs or gallops, PMI not laterally displaced GI- soft, non-tender, non-distended Extremities- no clubbing, cyanosis, or edema MS- no significant deformity or atrophy Skin- warm and dry, no rash or lesion Psych- euthymic mood, full affect Neuro- strength and sensation are intact   Labs:   Lab Results  Component Value Date   WBC 9.1 07/21/2015  HGB 15.4 07/21/2015   HCT 45.3 07/21/2015   MCV 90.4 07/21/2015   PLT 191 07/21/2015     Recent Labs Lab 10/22/15 0421  NA 134*  K 4.5  CL 98*  CO2 26  BUN 30*  CREATININE 1.64*  CALCIUM 9.0  GLUCOSE 118*     Discharge Medications:    Medication List    TAKE these medications        amLODipine 5 MG tablet  Commonly known as:  NORVASC  Take 1 tablet (5 mg total) by mouth 2 (two) times daily.     aspirin EC 81 MG tablet  Take 81 mg by mouth daily.       atorvastatin 20 MG tablet  Commonly known as:  LIPITOR  Take 1 tablet (20 mg total) by mouth daily.     carvedilol 12.5 MG tablet  Commonly known as:  COREG  Take 1.5 tablets (18.75 mg total) by mouth 2 (two) times daily with a meal.     citalopram 10 MG tablet  Commonly known as:  CELEXA  Take 10 mg by mouth at bedtime.     dofetilide 500 MCG capsule  Commonly known as:  TIKOSYN  Take 1 capsule (500 mcg total) by mouth 2 (two) times daily.     furosemide 20 MG tablet  Commonly known as:  LASIX  Take 1 tablet (20 mg total) by mouth daily.  Notes to Patient:  Do not take any lasix tomorrow, Saturday 10/23/15, then resume at the 20mg  daily dose     glimepiride 1 MG tablet  Commonly known as:  AMARYL  Take 1 mg by mouth daily with breakfast.     lisinopril 10 MG tablet  Commonly known as:  PRINIVIL,ZESTRIL  Take 1 tablet (10 mg total) by mouth daily.     sodium chloride 0.65 % nasal spray  Commonly known as:  OCEAN  Place 1-2 sprays into the nose daily as needed for congestion. Reported on 06/05/2015     spironolactone 25 MG tablet  Commonly known as:  ALDACTONE  Take 0.5 tablets (12.5 mg total) by mouth daily.     warfarin 5 MG tablet  Commonly known as:  COUMADIN  TAKE AS DIRECTED BY COUMADIN CLINIC  Notes to Patient:  Continue to take your Warfarin regime as you have been, as instructed by the coumadin clinic.        Disposition:  Home Discharge Instructions    Diet - low sodium heart healthy    Complete by:  As directed      Increase activity slowly    Complete by:  As directed           Follow-up Information    Follow up with [facility] On 10/28/2015.   Specialty:  Cardiology   Why:  9:30AM   Contact information:   [id number] [place] [phone]      Follow up with Cristopher Peru, MD On 11/23/2015.   Specialty:  Cardiology   Why:  3:15PM   Contact information:   [address]  [place] [address] [phone]       Follow up with [facility] On 11/02/2015.   Specialty:  Cardiology   Why:  7:45AM, coumadin clinic/lab draw   Contact information:   [address], [place] [phone]      Follow up with [name], MD  On 11/17/2015.   Specialty:  Cardiology   Why:  10:40AM   Contact information:   [place] [address] [phone]       Follow up with [facility] On 10/26/2015.   Specialty:  Cardiology   Why:  lab draw, you may arrive anytime after 7:30AM   Contact information:   [address] [place] [phone]      Duration of Discharge Encounter: Greater than 30 minutes including physician time.  Signed, Tommye Standard, PA-C  10/22/2015 12:15 PM    I have seen, examined the patient, and reviewed the above assessment and plan. On exam, RRR.  Changes to above are made where necessary.  QT remains stable.  Creatinine slightly elevated today.  Will need close outpatient followup of renal function.  Hold lasix x 24 hours then reduce to 20mg  daily.   Will enroll in ICM device clinic for hemodynamic monitoring via ICD.   Co Sign: Thompson Grayer, MD 10/22/2015 9:59 PM

## 2015-10-22 NOTE — Progress Notes (Signed)
[place] for warfarin Indication: atrial fibrillation  Allergies  Allergen Reactions  . Penicillins Swelling and Anaphylaxis  . Tape Other (See Comments)    Itch/break out/redness. Paper tape OK    Patient Measurements: Height: 6\' 1"  (185.4 cm) Weight: 223 lb 4.8 oz (101.288 kg) IBW/kg (Calculated) : 79.9   Vital Signs: Temp: 97.7 F (36.5 C) (06/15 0507) Temp Source: Oral (06/15 0507) BP: 130/64 mmHg (06/15 0507) Pulse Rate: 66 (06/15 0507)  Labs:  Recent Labs  10/20/15 0409 10/21/15 0446 10/22/15 0421  LABPROT 29.5* 26.7* 24.6*  INR 2.86* 2.50* 2.25*  CREATININE 1.15 1.38* 1.64*    Estimated Creatinine Clearance: 42 mL/min (by C-G formula based on Cr of 1.64).  Assessment: 86yom admitted for Tikosyn load for cardioversion for Afib.  Anticoagulated with warfarin INR 2.25 today.   PTA dose: 2.5 mg TTSS, 5mg  MWF  Mg 2.2, K 4.5, scr 1.15 > 1.38>1.64, est. crcl ~ 50 ml/min (based on TBW), QTc 440 > 478> 483  Goal of Therapy:  INR 2-3 Monitor platelets by anticoagulation protocol: Yes    Plan:  Continue home dose warfarin 5mg  MWF, 2.5mg  TTSS Daily INR   Hildred Laser, Pharm D 10/22/2015 8:43 AM

## 2015-10-23 ENCOUNTER — Telehealth: Payer: Self-pay

## 2015-10-23 NOTE — Telephone Encounter (Signed)
Referred to ICM by Dr Rayann Heman post hospital discharge.  Attempted call to patient and line busy x 3.  Patient will need home monitor if he agrees to [facility] follow up.

## 2015-10-26 ENCOUNTER — Other Ambulatory Visit (INDEPENDENT_AMBULATORY_CARE_PROVIDER_SITE_OTHER): Payer: Medicare Other | Admitting: *Deleted

## 2015-10-26 DIAGNOSIS — N183 Chronic kidney disease, stage 3 (moderate): Secondary | ICD-10-CM

## 2015-10-26 LAB — BASIC METABOLIC PANEL
BUN: 21 mg/dL (ref 7–25)
CHLORIDE: 100 mmol/L (ref 98–110)
CO2: 27 mmol/L (ref 20–31)
Calcium: 8.6 mg/dL (ref 8.6–10.3)
Creat: 1.06 mg/dL (ref 0.70–1.11)
GLUCOSE: 226 mg/dL — AB (ref 65–99)
POTASSIUM: 4.8 mmol/L (ref 3.5–5.3)
SODIUM: 135 mmol/L (ref 135–146)

## 2015-10-26 NOTE — Telephone Encounter (Signed)
Spoke with patient.  ICM intro given and explained program.  He agreed to monthly ICM calls but does not have a home monitor.  He stated last gen change was 02/2013.  Advised would order a home monitor.   He has a history of part bladder removal and 1 kidney removed due to cancer.  He currently has reoccurring prostate cancer but not seeking treatment at this time.

## 2015-10-28 ENCOUNTER — Ambulatory Visit (HOSPITAL_COMMUNITY)
Admission: RE | Admit: 2015-10-28 | Discharge: 2015-10-28 | Disposition: A | Discharge status: Home / Self Care | Payer: Medicare Other | Source: Ambulatory Visit | Attending: Nurse Practitioner | Admitting: Nurse Practitioner

## 2015-10-28 ENCOUNTER — Encounter (HOSPITAL_COMMUNITY): Payer: Self-pay | Admitting: Nurse Practitioner

## 2015-10-28 ENCOUNTER — Other Ambulatory Visit: Payer: Self-pay

## 2015-10-28 VITALS — BP 126/80 | HR 96 | Ht 73.0 in | Wt 228.0 lb

## 2015-10-28 DIAGNOSIS — Z87891 Personal history of nicotine dependence: Secondary | ICD-10-CM | POA: Diagnosis not present

## 2015-10-28 DIAGNOSIS — E119 Type 2 diabetes mellitus without complications: Secondary | ICD-10-CM | POA: Diagnosis not present

## 2015-10-28 DIAGNOSIS — Z7901 Long term (current) use of anticoagulants: Secondary | ICD-10-CM | POA: Insufficient documentation

## 2015-10-28 DIAGNOSIS — I481 Persistent atrial fibrillation: Secondary | ICD-10-CM

## 2015-10-28 DIAGNOSIS — Z7982 Long term (current) use of aspirin: Secondary | ICD-10-CM | POA: Diagnosis not present

## 2015-10-28 DIAGNOSIS — Z79899 Other long term (current) drug therapy: Secondary | ICD-10-CM | POA: Insufficient documentation

## 2015-10-28 DIAGNOSIS — F419 Anxiety disorder, unspecified: Secondary | ICD-10-CM | POA: Diagnosis not present

## 2015-10-28 DIAGNOSIS — I4891 Unspecified atrial fibrillation: Secondary | ICD-10-CM | POA: Diagnosis present

## 2015-10-28 DIAGNOSIS — E785 Hyperlipidemia, unspecified: Secondary | ICD-10-CM | POA: Insufficient documentation

## 2015-10-28 DIAGNOSIS — I444 Left anterior fascicular block: Secondary | ICD-10-CM | POA: Diagnosis not present

## 2015-10-28 DIAGNOSIS — I2510 Atherosclerotic heart disease of native coronary artery without angina pectoris: Secondary | ICD-10-CM | POA: Insufficient documentation

## 2015-10-28 DIAGNOSIS — I4819 Other persistent atrial fibrillation: Secondary | ICD-10-CM

## 2015-10-28 DIAGNOSIS — I10 Essential (primary) hypertension: Secondary | ICD-10-CM | POA: Insufficient documentation

## 2015-10-28 DIAGNOSIS — R9431 Abnormal electrocardiogram [ECG] [EKG]: Secondary | ICD-10-CM | POA: Diagnosis not present

## 2015-10-28 LAB — BASIC METABOLIC PANEL
Anion gap: 7 (ref 5–15)
BUN: 18 mg/dL (ref 6–20)
CALCIUM: 9.6 mg/dL (ref 8.9–10.3)
CHLORIDE: 100 mmol/L — AB (ref 101–111)
CO2: 27 mmol/L (ref 22–32)
CREATININE: 1.32 mg/dL — AB (ref 0.61–1.24)
GFR calc Af Amer: 55 mL/min — ABNORMAL LOW (ref >60)
GFR calc non Af Amer: 48 mL/min — ABNORMAL LOW (ref >60)
GLUCOSE: 190 mg/dL — AB (ref 65–99)
Potassium: 5.2 mmol/L — ABNORMAL HIGH (ref 3.5–5.1)
Sodium: 134 mmol/L — ABNORMAL LOW (ref 135–145)

## 2015-10-28 LAB — CBC
HEMATOCRIT: 43.9 % (ref 39.0–52.0)
HEMOGLOBIN: 14.1 g/dL (ref 13.0–17.0)
MCH: 29 pg (ref 26.0–34.0)
MCHC: 32.1 g/dL (ref 30.0–36.0)
MCV: 90.3 fL (ref 78.0–100.0)
Platelets: 202 10*3/uL (ref 150–400)
RBC: 4.86 MIL/uL (ref 4.22–5.81)
RDW: 14 % (ref 11.5–15.5)
WBC: 9.4 10*3/uL (ref 4.0–10.5)

## 2015-10-28 LAB — MAGNESIUM: Magnesium: 2.1 mg/dL (ref 1.7–2.4)

## 2015-10-28 LAB — PROTIME-INR
INR: 2.71 — AB (ref 0.00–1.49)
Prothrombin Time: 28.4 seconds — ABNORMAL HIGH (ref 11.6–15.2)

## 2015-10-28 NOTE — Progress Notes (Signed)
Patient ID: [name] [name], male   DOB: [birth year], 82 y.o.   MRN: [id number]     Primary Care Physician: Simona Huh, MD Referring Physician: Dr. Rayann Heman Cardiologist: Dr. Janace Hoard is a 82 y.o. male with a h/o persistent AF that is in the afib clinic for f/u after recent tikosyn load. He did convert to SR with the drug, but reports that he feels that he did return to Afib this past Monday and EKG confirms rate controlled afib.He does feel better in SR and would like to get back into  SR.Continues on tikosyn 500 mg bid, taking correctly and reviewed INR's have been therapeutic.   Today, he denies symptoms of   chest pain, shortness of breath, orthopnea, PND, lower extremity edema, dizziness, presyncope, syncope, or neurologic sequela. Positive for fatigue. The patient is tolerating medications without difficulties and is otherwise without complaint today.   Past Medical History  Diagnosis Date  . PVCs (premature ventricular contractions)   . Heart palpitations   . Coronary artery disease   . Hypertension   . Atrial fibrillation ([place])   . Anxiety     CHRONIC  . Myocardial infarction ([place]) 1995    remote anterior   . Ventricular tachycardia ([place])   . Bladder cancer ([place])   . Hyperthyroidism   . Patent foramen ovale   . [place]ural thrombus of heart ([place])   . Genitourinary disease     CANCER  . Hyperlipidemia   . Stroke [facility]) 2006  . Left ventricular dysfunction   . Persistent atrial fibrillation ([place])   . Diabetes mellitus type 2, noninsulin dependent [facility])    Past Surgical History  Procedure Laterality Date  . Cardiac defibrillator placement  05/25/2005    St Jude, Dr Lovena Le  . Nephrectomy Right 1992       . Cystectomy  1992    PART OF IT REMOVED  . Cardiac catheterization    . Prostatectomy      TOTAL  . Eye surgery  2000    CATARACT BOTH EYES  . Hernia repair  1956  . Implantable cardioverter defibrillator generator change N/A 02/20/2013    Procedure:  IMPLANTABLE CARDIOVERTER DEFIBRILLATOR GENERATOR CHANGE;  Surgeon: Evans Lance, MD;  Location: [facility] C[facility] LAB;  Service: Cardiovascular;  [employer] Jude ICD, single chamber    Current Outpatient Prescriptions  Medication Sig Dispense Refill  . amLODipine (NORVASC) 5 MG tablet Take 1 tablet (5 mg total) by mouth 2 (two) times daily. 180 tablet 3  . aspirin EC 81 MG tablet Take 81 mg by mouth daily.    Marland Kitchen atorvastatin (LIPITOR) 20 MG tablet Take 1 tablet (20 mg total) by mouth daily. 90 tablet 3  . carvedilol (COREG) 12.5 MG tablet Take 1.5 tablets (18.75 mg total) by mouth 2 (two) times daily with a meal. 90 tablet 6  . citalopram (CELEXA) 10 MG tablet Take 10 mg by mouth at bedtime.     . dofetilide (TIKOSYN) 500 MCG capsule Take 1 capsule (500 mcg total) by mouth 2 (two) times daily. 60 capsule 3  . furosemide (LASIX) 20 MG tablet Take 1 tablet (20 mg total) by mouth daily. 30 tablet 3  . glimepiride (AMARYL) 1 MG tablet Take 1 mg by mouth daily with breakfast.    . lisinopril (PRINIVIL,ZESTRIL) 10 MG tablet Take 1 tablet (10 mg total) by mouth daily. 90 tablet 3  . sodium chloride (OCEAN) 0.65 % nasal spray Place 1-2 sprays into the  nose daily as needed for congestion. Reported on 06/05/2015    . spironolactone (ALDACTONE) 25 MG tablet Take 0.5 tablets (12.5 mg total) by mouth daily. 45 tablet 6  . warfarin (COUMADIN) 5 MG tablet TAKE AS DIRECTED BY COUMADIN CLINIC 90 tablet 1   No current facility-administered medications for this encounter.    Allergies  Allergen Reactions  . Penicillins Swelling and Anaphylaxis  . Tape Other (See Comments)    Itch/break out/redness. Paper tape OK    Social History   Social History  . Marital Status: Married    Spouse Name: N/A  . Number of Children: N/A  . Years of Education: N/A   Occupational History  . Retired    Social History Main Topics  . Smoking status: Former Smoker    Quit date: 05/09/1990  . Smokeless tobacco: Never Used  . Alcohol  Use: No  . Drug Use: No  . Sexual Activity: No   Other Topics Concern  . Not on file   Social History Narrative   Pt lives alone since his wife died 08/29/10.    Family History  Problem Relation Age of Onset  . Heart attack Father   . Hypertension Mother   . Stroke Sister   . Stroke Paternal Grandfather   . Heart attack Maternal Grandmother     ROS- All systems are reviewed and negative except as per the HPI above  Physical Exam: Filed Vitals:   10/28/15 0928  BP: 126/80  Pulse: 96  Height: 6\' 1"  (1.854 m)  Weight: 228 lb (103.42 kg)    GEN- The patient is well appearing, alert and oriented x 3 today.   Head- normocephalic, atraumatic Eyes-  Sclera clear, conjunctiva pink Ears- hearing intact Oropharynx- clear Neck- supple, no JVP Lymph- no cervical lymphadenopathy Lungs- Clear to ausculation bilaterally, normal work of breathing Heart- irregular rate and rhythm, no murmurs, rubs or gallops, PMI not laterally displaced GI- soft, NT, ND, + BS Extremities- no clubbing, cyanosis, or edema MS- no significant deformity or atrophy Skin- no rash or lesion Psych- euthymic mood, full affect Neuro- strength and sensation are intact  EKG-afib at 96 bpm, qrs int 122 ms, qtc 429 ms, qtc 429 ms( Stable) Epic records reviewed  Assessment and Plan: 1. Persistent afib, early return of afib on tikosyn Continue tikosyn 500 mg bid Continue carvedilol Continue warfarin  CBC, bmet, mag, INR today All INR's in [facility]  have been therapeutic, INR last found to be sub therapeutic was 08/06/15 at 1.7 Will schedule  for DCCV with Dr. Aundra Dubin 6/29 Continue warfain  F/u cardioversion with Dr. Aundra Dubin 7/11 Dr. Lovena Le 7/17

## 2015-10-28 NOTE — Patient Instructions (Signed)
Cardioversion scheduled for Thursday, June 29th  - Arrive at the [facility] and go to admitting at [facility] not eat or drink anything after midnight the night prior to your procedure.  - Take all your medication with a sip of water prior to arrival.  - You will not be able to drive home after your procedure.

## 2015-10-30 ENCOUNTER — Telehealth: Payer: Self-pay | Admitting: *Deleted

## 2015-10-30 NOTE — Telephone Encounter (Signed)
Spoke with patient.  Explained new monitor has been ordered and should receive in the next week.  Provided number and encouraged to call for fluid symptoms and to call when he receives monitor.  He stated he has cardioversion scheduled for 11/05/2015.  He reported the Tikosyn causes him to feel nauseated with occasional vomiting.  He is going to talk with MD regarding the side effect.   He has appointment with CHF clinic 11/17/2015 and with Dr Lovena Le on 11/23/2015.

## 2015-10-30 NOTE — Telephone Encounter (Signed)
-----   Message from [facility], [place] sent at 10/26/2015  4:45 PM EDT ----- Please let the patient know his kidney function looks much improved, to continue the same medicine regime, remind him of his visits with AFib clinic adn Dr. Lovena Le.   Thanks [employer]

## 2015-11-02 ENCOUNTER — Ambulatory Visit (INDEPENDENT_AMBULATORY_CARE_PROVIDER_SITE_OTHER): Payer: Medicare Other | Admitting: *Deleted

## 2015-11-02 DIAGNOSIS — Z5181 Encounter for therapeutic drug level monitoring: Secondary | ICD-10-CM

## 2015-11-02 DIAGNOSIS — I481 Persistent atrial fibrillation: Secondary | ICD-10-CM | POA: Diagnosis not present

## 2015-11-02 DIAGNOSIS — I4891 Unspecified atrial fibrillation: Secondary | ICD-10-CM

## 2015-11-02 DIAGNOSIS — Z7901 Long term (current) use of anticoagulants: Secondary | ICD-10-CM

## 2015-11-02 DIAGNOSIS — I4819 Other persistent atrial fibrillation: Secondary | ICD-10-CM

## 2015-11-02 LAB — POCT INR: INR: 3

## 2015-11-04 ENCOUNTER — Ambulatory Visit (HOSPITAL_COMMUNITY)
Admission: RE | Admit: 2015-11-04 | Discharge: 2015-11-04 | Disposition: A | Discharge status: Home / Self Care | Payer: Medicare Other | Source: Ambulatory Visit | Attending: Nurse Practitioner | Admitting: Nurse Practitioner

## 2015-11-04 DIAGNOSIS — I444 Left anterior fascicular block: Secondary | ICD-10-CM | POA: Diagnosis not present

## 2015-11-04 DIAGNOSIS — I4891 Unspecified atrial fibrillation: Secondary | ICD-10-CM | POA: Insufficient documentation

## 2015-11-04 DIAGNOSIS — I480 Paroxysmal atrial fibrillation: Secondary | ICD-10-CM | POA: Diagnosis not present

## 2015-11-04 DIAGNOSIS — I440 Atrioventricular block, first degree: Secondary | ICD-10-CM | POA: Insufficient documentation

## 2015-11-04 NOTE — Progress Notes (Addendum)
Patient called in stating he felt he had converted to NSR. In for EKG. Roderic Palau NP to review  States he has been in SR x 2-3 days . EKG confirms SR at 72 bpm, with first degree AV block. LAFB pr int 210 ms, qrs int 112 ms, qtc 462 ms. DCCV will be cancelled. F/u with Dr. Aundra Dubin 7/11.

## 2015-11-05 ENCOUNTER — Encounter (HOSPITAL_COMMUNITY): Admission: RE | Payer: Self-pay | Source: Ambulatory Visit

## 2015-11-05 ENCOUNTER — Ambulatory Visit (HOSPITAL_COMMUNITY): Admission: RE | Admit: 2015-11-05 | Payer: Medicare Other | Source: Ambulatory Visit | Admitting: Cardiology

## 2015-11-05 SURGERY — CARDIOVERSION
Anesthesia: Monitor Anesthesia Care

## 2015-11-06 ENCOUNTER — Telehealth: Payer: Self-pay

## 2015-11-06 NOTE — Telephone Encounter (Signed)
Call to patient and he confirmed he received new monitor.   He has 2 office appointments in July.  1st ICM remote transmission scheduled for 12/21/2015.  He denied any fluid symptoms today. Encouraged to call for fluid symptoms.

## 2015-11-13 ENCOUNTER — Ambulatory Visit (INDEPENDENT_AMBULATORY_CARE_PROVIDER_SITE_OTHER): Payer: Medicare Other | Admitting: Pharmacist

## 2015-11-13 DIAGNOSIS — Z5181 Encounter for therapeutic drug level monitoring: Secondary | ICD-10-CM | POA: Diagnosis not present

## 2015-11-13 DIAGNOSIS — I481 Persistent atrial fibrillation: Secondary | ICD-10-CM

## 2015-11-13 DIAGNOSIS — I4819 Other persistent atrial fibrillation: Secondary | ICD-10-CM

## 2015-11-13 DIAGNOSIS — I4891 Unspecified atrial fibrillation: Secondary | ICD-10-CM | POA: Diagnosis not present

## 2015-11-13 DIAGNOSIS — Z7901 Long term (current) use of anticoagulants: Secondary | ICD-10-CM

## 2015-11-13 LAB — POCT INR: INR: 4.7

## 2015-11-17 ENCOUNTER — Ambulatory Visit (HOSPITAL_COMMUNITY)
Admission: RE | Admit: 2015-11-17 | Discharge: 2015-11-17 | Disposition: A | Discharge status: Home / Self Care | Payer: Medicare Other | Source: Ambulatory Visit | Attending: Cardiology | Admitting: Cardiology

## 2015-11-17 VITALS — BP 134/64 | HR 66 | Wt 222.5 lb

## 2015-11-17 DIAGNOSIS — I5022 Chronic systolic (congestive) heart failure: Secondary | ICD-10-CM | POA: Diagnosis not present

## 2015-11-17 DIAGNOSIS — I480 Paroxysmal atrial fibrillation: Secondary | ICD-10-CM | POA: Insufficient documentation

## 2015-11-17 DIAGNOSIS — R9431 Abnormal electrocardiogram [ECG] [EKG]: Secondary | ICD-10-CM | POA: Insufficient documentation

## 2015-11-17 DIAGNOSIS — I444 Left anterior fascicular block: Secondary | ICD-10-CM | POA: Insufficient documentation

## 2015-11-17 DIAGNOSIS — I481 Persistent atrial fibrillation: Secondary | ICD-10-CM

## 2015-11-17 DIAGNOSIS — F329 Major depressive disorder, single episode, unspecified: Secondary | ICD-10-CM | POA: Insufficient documentation

## 2015-11-17 DIAGNOSIS — Z7982 Long term (current) use of aspirin: Secondary | ICD-10-CM | POA: Insufficient documentation

## 2015-11-17 DIAGNOSIS — E785 Hyperlipidemia, unspecified: Secondary | ICD-10-CM | POA: Insufficient documentation

## 2015-11-17 DIAGNOSIS — Z8673 Personal history of transient ischemic attack (TIA), and cerebral infarction without residual deficits: Secondary | ICD-10-CM | POA: Insufficient documentation

## 2015-11-17 DIAGNOSIS — I213 ST elevation (STEMI) myocardial infarction of unspecified site: Secondary | ICD-10-CM | POA: Diagnosis not present

## 2015-11-17 DIAGNOSIS — C61 Malignant neoplasm of prostate: Secondary | ICD-10-CM | POA: Diagnosis not present

## 2015-11-17 DIAGNOSIS — I2510 Atherosclerotic heart disease of native coronary artery without angina pectoris: Secondary | ICD-10-CM | POA: Insufficient documentation

## 2015-11-17 DIAGNOSIS — I252 Old myocardial infarction: Secondary | ICD-10-CM | POA: Diagnosis not present

## 2015-11-17 DIAGNOSIS — Z87891 Personal history of nicotine dependence: Secondary | ICD-10-CM | POA: Insufficient documentation

## 2015-11-17 DIAGNOSIS — Z905 Acquired absence of kidney: Secondary | ICD-10-CM | POA: Diagnosis not present

## 2015-11-17 DIAGNOSIS — I4819 Other persistent atrial fibrillation: Secondary | ICD-10-CM

## 2015-11-17 DIAGNOSIS — Z8551 Personal history of malignant neoplasm of bladder: Secondary | ICD-10-CM | POA: Insufficient documentation

## 2015-11-17 DIAGNOSIS — E1122 Type 2 diabetes mellitus with diabetic chronic kidney disease: Secondary | ICD-10-CM | POA: Diagnosis not present

## 2015-11-17 DIAGNOSIS — I513 Intracardiac thrombosis, not elsewhere classified: Secondary | ICD-10-CM

## 2015-11-17 DIAGNOSIS — I130 Hypertensive heart and chronic kidney disease with heart failure and stage 1 through stage 4 chronic kidney disease, or unspecified chronic kidney disease: Secondary | ICD-10-CM | POA: Diagnosis present

## 2015-11-17 DIAGNOSIS — N183 Chronic kidney disease, stage 3 (moderate): Secondary | ICD-10-CM | POA: Insufficient documentation

## 2015-11-17 LAB — COMPREHENSIVE METABOLIC PANEL
ALBUMIN: 3.1 g/dL — AB (ref 3.5–5.0)
ALT: 16 U/L — ABNORMAL LOW (ref 17–63)
ANION GAP: 8 (ref 5–15)
AST: 22 U/L (ref 15–41)
Alkaline Phosphatase: 1197 U/L — ABNORMAL HIGH (ref 38–126)
BUN: 22 mg/dL — ABNORMAL HIGH (ref 6–20)
CHLORIDE: 99 mmol/L — AB (ref 101–111)
CO2: 25 mmol/L (ref 22–32)
Calcium: 9.3 mg/dL (ref 8.9–10.3)
Creatinine, Ser: 1.31 mg/dL — ABNORMAL HIGH (ref 0.61–1.24)
GFR calc Af Amer: 56 mL/min — ABNORMAL LOW (ref >60)
GFR calc non Af Amer: 48 mL/min — ABNORMAL LOW (ref >60)
GLUCOSE: 150 mg/dL — AB (ref 65–99)
POTASSIUM: 5 mmol/L (ref 3.5–5.1)
SODIUM: 132 mmol/L — AB (ref 135–145)
Total Bilirubin: 0.8 mg/dL (ref 0.3–1.2)
Total Protein: 7.3 g/dL (ref 6.5–8.1)

## 2015-11-17 NOTE — Progress Notes (Signed)
Patient ID: [name] [name], male   DOB: 02/18/1930, 82 y.o.   MRN: [id number] PCP: Dr. Marisue Humble Cardiology: Dr. Aundra Dubin  82 yo with history of CAD, ischemic cardiomyopathy, and atrial fibrillation presents for cardiology followup.  In 5/17, he was seen as an add-on in the office with increased dyspnea.  He was found to be back in atrial fibrillation.  Lasix was increased and sotalol was stopped due to CHF.  He was admitted in 6/17 for loading of dofetilide and he converted back to NSR.  He was seen soon after in atrial fibrillation clinic and was back in atrial fibrillation, but he converted back to NSR spontaneously and is in NSR today.  He feels considerably better when in NSR.   Of note, he had his PSA drawn yesterday and it was markedly higher, 127.  He is following up with Dr Rosana Hoes his urologist.  He needs a bone scan.  He has generalized fatigue and low back pain.  Appetite is poor and he has noted nausea as well as an 11 lb weight loss since last appointment.   Currently, he is not short of breath if he walks slowly on flat ground.  He gets short of breath with fast walking and with carrying a heavy load like groceries.  No chest pain.  No orthopnea/PND.  No BRBPR or melena.    ECG: NSR with old ASMI, QTc 472 msec.   Corevue: Stable thoracic impedence, no VT  Labs (4/12): LDL 56, HDL 31 Labs (10/12): K 4.4, creatinine 1.2, TSH normal, HCT 48 Labs (11/12): K 4.3, creatinine 1.2 (creatinine clearance > 60), LFTs normal, LDL 53, HDL 31 Labs (3/13): K 4.8, creatinine 1.4, Mg 2.1 Labs (6/13): HDL 35, LDL 63 Labs (7/13): BNP 112, K 4.8, creatinine 1.4 Labs (10/13): K 5, creatinine 1.3, BNP 90 Labs (1/14): K 4.6, creatinine 1.3 Labs (3/14): LDL 55, HDL 27 Labs (5/14): K 4.8, creatinine 1.4 Labs (10/14): K 4.6, creatinine 1.5 Labs (1/15): K 4.8, creatinine 1.5, BNP 68, HDL 29, LDL 69 Labs (8/15): K 4.6, creatinine 1.5, BNP 87 Labs (12/15): K 4.4, creatinine 1.5, HCT 42.2, LDL 58, HDL 26 Labs  (12/16): K 4.7, creatinine 1.5 Labs (3/17): LDL 71, HDL 32 Labs (5/17): K 4.3, creatinine 1.48, BNP 334 Labs (7/17): PSA 127, K 5.2, creatinine 1.32, HCT 43.7  PMH: 1.  CAD: Anterior MI 1995.  Myoview (6/09) with fixed anterior and apical defect, EF 36%.  2.  LV mural thrombus noted in 2006.  Patient had CVA related to this.  He is on chronic coumadin.  3.  Ischemic cardiomyopathy: Echo (10/12) with EF 30-35%, mild LV hypertrophy, apical septal and anterior akinesis, no LV thrombus seen, mild MR.  Patient has a [employer] officer, political party ICD.  Echo (4/16) with EF 35-40%, mild LV dilation, anteroseptal and apical akinesis.  4.  H/o VT 5.  Prostate cancer s/p radiation 6.  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  S/p multiple surgeries for excision and right nephrectomy.  7.  PFO 8.  Atrial fibrillation: Paroxysmal.  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  Taken off sotalol in 5/17 in the setting of CHF.   - Tikosyn started in 6/17 with conversion to NSR.  9.  HTN 10. Hyperlipidemia 11. Diabetes mellitus  12. CKD 13. Depression  SH: Retired Biochemist, clinical for [employer].  Lives in [place].  Widower.  Has 3 sons.  Quit smoking in 1991.   FH: No premature CAD   ROS: All systems  reviewed and negative except as per HPI.    Current Outpatient Prescriptions  Medication Sig Dispense Refill  . amLODipine (NORVASC) 5 MG tablet Take 1 tablet (5 mg total) by mouth 2 (two) times daily. 180 tablet 3  . aspirin EC 81 MG tablet Take 81 mg by mouth daily.    Marland Kitchen atorvastatin (LIPITOR) 20 MG tablet Take 1 tablet (20 mg total) by mouth daily. 90 tablet 3  . bicalutamide (CASODEX) 50 MG tablet Take 50 mg by mouth daily.    . carvedilol (COREG) 12.5 MG tablet Take 1.5 tablets (18.75 mg total) by mouth 2 (two) times daily with a meal. 90 tablet 6  . citalopram (CELEXA) 10 MG tablet Take 10 mg by mouth at bedtime.     . dofetilide (TIKOSYN) 500 MCG capsule Take 1 capsule (500 mcg total) by mouth 2 (two)  times daily. 60 capsule 3  . furosemide (LASIX) 20 MG tablet Take 1 tablet (20 mg total) by mouth daily. 30 tablet 3  . glimepiride (AMARYL) 1 MG tablet Take 1 mg by mouth daily with breakfast.    . lisinopril (PRINIVIL,ZESTRIL) 10 MG tablet Take 1 tablet (10 mg total) by mouth daily. 90 tablet 3  . sodium chloride (OCEAN) 0.65 % nasal spray Place 1-2 sprays into the nose daily as needed for congestion. Reported on 06/05/2015    . spironolactone (ALDACTONE) 25 MG tablet Take 0.5 tablets (12.5 mg total) by mouth daily. 45 tablet 6  . warfarin (COUMADIN) 5 MG tablet TAKE AS DIRECTED BY COUMADIN CLINIC 90 tablet 1   No current facility-administered medications for this encounter.    BP 134/64 mmHg  Pulse 66  Wt 222 lb 8 oz (100.925 kg)  SpO2 97% General: NAD Neck: JVP 7 cm, no thyromegaly or thyroid nodule.  Lungs: Clear to auscultation bilaterally with normal respiratory effort. CV: Nondisplaced PMI.  Heart irregular S1/S2, no S3/S4, 1/6 SEM.  No edema.  No carotid bruit.  Normal pedal pulses.  Abdomen: Soft, nontender, no hepatosplenomegaly, no distention.  Neurologic: Alert and oriented x 3.  Psych: Normal affect. Extremities: No clubbing or cyanosis.   Assessment/Plan:  Atrial fibrillation Paroxysmal.  He is in NSR on dofetilide currently.  Feels much better in NSR.      - Given history of LV thrombus, would use warfarin instead of NOAC.  - Continue dofetilide, QTc ok on today's ECG.  BMET today.  CAD Stable with no chest pain. Continue ASA 81, lisinopril, Coreg, and statin. Hyperlipidemia  Continue atorvastatin.  Good lipids in 3/17.  Systolic CHF, chronic  NYHA class II symptoms, appears euvolemic. St Jude ICD.  EF 35-40% on last echo in 4/16.  - Continue Coreg 18.75 bid (very fatigued after increasing to 25 bid).    - Continue current Lasix, check BMET.  - Continue spironolactone 12.5 daily.  K was borderline elevated when last checked. - Continue current lisinopril.    CKD Stage 3.  Creatinine has been stable.  H/o LV thrombus He is on warfarin.  Prostate Cancer Prior treatment with radiation.  PSA markedly higher at 127.  Seeing his urologist, needs bone scan which I will order today.  I am concerned that weight loss, fatigue, and nausea may be due to metastatic prostate cancer.   Followup in 3 months.  Will have bone scan results sent to Dr Rosana Hoes.   [name] 11/17/2015

## 2015-11-17 NOTE — Patient Instructions (Addendum)
Labs today  Bone Scan, we will call you to schedule   We will contact you in 3 months to schedule your next appointment.

## 2015-11-20 ENCOUNTER — Telehealth (HOSPITAL_COMMUNITY): Payer: Self-pay

## 2015-11-20 ENCOUNTER — Telehealth (HOSPITAL_COMMUNITY): Payer: Self-pay | Admitting: *Deleted

## 2015-11-20 NOTE — Telephone Encounter (Signed)
Notes Recorded by Harvie Junior, [place] on 11/20/2015 at 11:07 AM Results routed to [place] Notes Recorded by Larey Dresser, MD on 11/20/2015 at 12:21 AM AST and ALT normal. I am concerned with elevated alkaline phosphatase that he has bony mets from prostate cancer. Please forward to Tresa Endo his urologist. Notes Recorded by Scarlette Calico, RN on 11/17/2015 at 4:20 PM Labs reviewed by RN, will forward to MD for review       Ref Range 3d ago (11/17/15) 3wk ago (10/28/15) 3wk ago (10/26/15) 4wk ago (10/22/15) 91mo ago (10/21/15)   Sodium 135 - 145 mmol/L 132 (L) 134 (L) 135R 134 (L) 134 (L)   Potassium 3.5 - 5.1 mmol/L 5.0 5.2 (H) 4.8R 4.5 4.1   Chloride 101 - 111 mmol/L 99 (L) 100 (L) 100R 98 (L) 102   CO2 22 - 32 mmol/L 25 27 27R 26 26   Glucose, Bld 65 - 99 mg/dL 150 (H) 190 (H) 226 (H) 118 (H) 149 (H)   BUN 6 - 20 mg/dL 22 (H) 18 21R 30 (H) 24 (H)   Creatinine, Ser 0.61 - 1.24 mg/dL 1.31 (H) 1.32 (H) 1.06R, CM 1.64 (H) 1.38 (H)   Calcium 8.9 - 10.3 mg/dL 9.3 9.6 8.6R 9.0 9.1   Total Protein 6.5 - 8.1 g/dL 7.3       Albumin 3.5 - 5.0 g/dL 3.1 (L)       AST 15 - 41 U/L 22       ALT 17 - 63 U/L 16 (L)       Alkaline Phosphatase 38 - 126 U/L 1197 (H)       Total Bilirubin 0.3 - 1.2 mg/dL 0.8       GFR calc non Af Amer >60 mL/min 48 (L) 60 mL/min" class="rz_1r" style="cursor: pointer;" onmouseover='jscript: var varStyle="underline"; var bgColor="#DCD7CE"; this.style.backgroundColor=bgColor; var children=this.getElementsByTagName("div"); for(var child=0;child 48 (L)  60 mL/min" class="rz_1r" style="cursor: pointer;" onmouseover='jscript: var varStyle="underline"; var bgColor="#DCD7CE"; this.style.backgroundColor=bgColor; var children=this.getElementsByTagName("div"); for(var child=0;child 37 (L) 60 mL/min" class="rz_1s" style="cursor: pointer; background-color: rgb(228, 223, 214);" onmouseover='jscript: var varStyle="underline"; var bgColor="#DCD7CE";  this.style.backgroundColor=bgColor; var children=this.getElementsByTagName("div"); for(var child=0;child 45 (L)   GFR calc Af Amer >60 mL/min 56 (L) 60 mL/min" class="rz_1r" style="cursor: pointer;" onmouseover='jscript: var varStyle="underline"; var bgColor="#DCD7CE"; this.style.backgroundColor=bgColor; var children=this.getElementsByTagName("div"); for(var child=0;child 55 (L)CM  60 mL/min" class="rz_1r" style="cursor: pointer; background-color: rgb(228, 223, 214);" onmouseover='jscript: var varStyle="underline"; var bgColor="#DCD7CE"; this.style.backgroundColor=bgColor; var children=this.getElementsByTagName("div"); for(var child=0;child 43 (L)CM 60 mL/min" class="rz_1s" style="cursor: pointer; background-color: rgb(228, 223, 214);" onmouseover='jscript: var varStyle="underline"; var bgColor="#DCD7CE"; this.style.backgroundColor=bgColor; var children=this.getElementsByTagName("div"); for(var child=0;child 53 (L)CM

## 2015-11-20 NOTE — Telephone Encounter (Signed)
Patient family member called CHF clinic triage line wanting to know when patient will be scheduled for a full body bone scan. Per chart, patient is currently scheduled for this procedure 11/26/15 at 11:00. Left return VM to patient that this was scheduled, date, time, and location. Advised to call oncologist to make sure no prep is required for this procedure (npo, meds, etc.)  Renee Pain

## 2015-11-23 ENCOUNTER — Encounter: Payer: Self-pay | Admitting: Internal Medicine

## 2015-11-23 ENCOUNTER — Ambulatory Visit (INDEPENDENT_AMBULATORY_CARE_PROVIDER_SITE_OTHER): Payer: Medicare Other | Admitting: Internal Medicine

## 2015-11-23 ENCOUNTER — Ambulatory Visit (INDEPENDENT_AMBULATORY_CARE_PROVIDER_SITE_OTHER): Payer: Medicare Other | Admitting: *Deleted

## 2015-11-23 VITALS — BP 94/50 | HR 81 | Ht 73.0 in | Wt 219.0 lb

## 2015-11-23 DIAGNOSIS — Z7901 Long term (current) use of anticoagulants: Secondary | ICD-10-CM | POA: Diagnosis not present

## 2015-11-23 DIAGNOSIS — I472 Ventricular tachycardia, unspecified: Secondary | ICD-10-CM

## 2015-11-23 DIAGNOSIS — Z95810 Presence of automatic (implantable) cardiac defibrillator: Secondary | ICD-10-CM

## 2015-11-23 DIAGNOSIS — I4891 Unspecified atrial fibrillation: Secondary | ICD-10-CM

## 2015-11-23 DIAGNOSIS — I4819 Other persistent atrial fibrillation: Secondary | ICD-10-CM

## 2015-11-23 DIAGNOSIS — I5022 Chronic systolic (congestive) heart failure: Secondary | ICD-10-CM

## 2015-11-23 DIAGNOSIS — I2510 Atherosclerotic heart disease of native coronary artery without angina pectoris: Secondary | ICD-10-CM | POA: Diagnosis not present

## 2015-11-23 DIAGNOSIS — I481 Persistent atrial fibrillation: Secondary | ICD-10-CM

## 2015-11-23 DIAGNOSIS — Z5181 Encounter for therapeutic drug level monitoring: Secondary | ICD-10-CM

## 2015-11-23 LAB — POCT INR: INR: 4.4

## 2015-11-23 LAB — CUP PACEART INCLINIC DEVICE CHECK
Battery Remaining Longevity: 86.4
Date Time Interrogation Session: 20170717174605
HIGH POWER IMPEDANCE MEASURED VALUE: 59.3438
Implantable Lead Implant Date: 20070117
Lead Channel Sensing Intrinsic Amplitude: 11.8 mV
Lead Channel Setting Pacing Amplitude: 2.5 V
Lead Channel Setting Pacing Pulse Width: 0.4 ms
Lead Channel Setting Sensing Sensitivity: 0.5 mV
MDC IDC LEAD LOCATION: 753860
MDC IDC LEAD MODEL: 7001
MDC IDC MSMT LEADCHNL RV IMPEDANCE VALUE: 550 Ohm
MDC IDC MSMT LEADCHNL RV PACING THRESHOLD AMPLITUDE: 0.75 V
MDC IDC MSMT LEADCHNL RV PACING THRESHOLD PULSEWIDTH: 0.4 ms
MDC IDC STAT BRADY RV PERCENT PACED: 0.42 %
Pulse Gen Serial Number: 7139864

## 2015-11-23 NOTE — Progress Notes (Signed)
HPI [name] returns today for followup. He is a pleasant 82 yo man with a h/o chronic systolic heart failure, ventricular tachycardia, paroxysmal atrial fibrillation, and coronary artery disease. The patient is status post ICD generator replacement.  He returns today for followup. He has had no recent ICD shocks and has tolerated Tikosyn very well. He has minimal peripheral edema. He does admit to some sodium and dietary indiscretion and has class II heart failure symptoms. No syncope. He has been diagnosed with worsening prostate CA.  Allergies  Allergen Reactions  . Penicillins Swelling and Anaphylaxis  . Tape Other (See Comments)    Itch/break out/redness. Paper tape OK     Current Outpatient Prescriptions  Medication Sig Dispense Refill  . amLODipine (NORVASC) 5 MG tablet Take 1 tablet (5 mg total) by mouth 2 (two) times daily. 180 tablet 3  . aspirin EC 81 MG tablet Take 81 mg by mouth daily.    Marland Kitchen atorvastatin (LIPITOR) 20 MG tablet Take 1 tablet (20 mg total) by mouth daily. 90 tablet 3  . bicalutamide (CASODEX) 50 MG tablet Take 50 mg by mouth daily.    . carvedilol (COREG) 12.5 MG tablet Take 1.5 tablets (18.75 mg total) by mouth 2 (two) times daily with a meal. 90 tablet 6  . citalopram (CELEXA) 10 MG tablet Take 10 mg by mouth at bedtime.     . dofetilide (TIKOSYN) 500 MCG capsule Take 1 capsule (500 mcg total) by mouth 2 (two) times daily. 60 capsule 3  . furosemide (LASIX) 20 MG tablet Take 1 tablet (20 mg total) by mouth daily. 30 tablet 3  . glimepiride (AMARYL) 1 MG tablet Take 1 mg by mouth daily with breakfast.    . lisinopril (PRINIVIL,ZESTRIL) 10 MG tablet Take 1 tablet (10 mg total) by mouth daily. 90 tablet 3  . sodium chloride (OCEAN) 0.65 % nasal spray Place 1-2 sprays into the nose daily as needed for congestion. Reported on 06/05/2015    . spironolactone (ALDACTONE) 25 MG tablet Take 0.5 tablets (12.5 mg total) by mouth daily. 45 tablet 6  . warfarin (COUMADIN) 5 MG  tablet TAKE AS DIRECTED BY COUMADIN CLINIC 90 tablet 1   No current facility-administered medications for this visit.     Past Medical History  Diagnosis Date  . PVCs (premature ventricular contractions)   . Heart palpitations   . Coronary artery disease   . Hypertension   . Atrial fibrillation ([place])   . Anxiety     CHRONIC  . Myocardial infarction ([place]) 1995    remote anterior   . Ventricular tachycardia ([place])   . Bladder cancer ([place])   . Hyperthyroidism   . Patent foramen ovale   . Mural thrombus of heart ([place])   . Genitourinary disease     CANCER  . Hyperlipidemia   . Stroke [facility]) 2006  . Left ventricular dysfunction   . Persistent atrial fibrillation ([place])   . Diabetes mellitus type 2, noninsulin dependent ([place])     ROS:   All systems reviewed and negative except as noted in the HPI.   Past Surgical History  Procedure Laterality Date  . Cardiac defibrillator placement  05/25/2005    St Jude, Dr Lovena Le  . Nephrectomy Right 1992       . Cystectomy  1992    PART OF IT REMOVED  . Cardiac catheterization    . Prostatectomy      TOTAL  . Eye surgery  2000  CATARACT BOTH EYES  . Hernia repair  1956  . Implantable cardioverter defibrillator generator change N/A 02/20/2013    Procedure: IMPLANTABLE CARDIOVERTER DEFIBRILLATOR GENERATOR CHANGE;  Surgeon: Evans Lance, MD;  Location: [facility] C[facility] LAB;  Service: Cardiovascular;  St Jude ICD, single chamber     Family History  Problem Relation Age of Onset  . Heart attack Father   . Hypertension Mother   . Stroke Sister   . Stroke Paternal Grandfather   . Heart attack Maternal Grandmother      Social History   Social History  . Marital Status: Married    Spouse Name: N/A  . Number of Children: N/A  . Years of Education: N/A   Occupational History  . Retired    Social History Main Topics  . Smoking status: Former Smoker    Quit date: 05/09/1990  . Smokeless tobacco: Never Used  . Alcohol Use: No  .  Drug Use: No  . Sexual Activity: No   Other Topics Concern  . Not on file   Social History Narrative   Pt lives alone since his wife died August 15, 2010.     BP 94/50 mmHg  Pulse 81  Ht 6\' 1"  (1.854 m)  Wt 219 lb (99.338 kg)  BMI 28.90 kg/m2  Physical Exam:  Well appearing 82 year old man, NAD HEENT: Unremarkable Neck:  6 cm JVD, no thyromegally Back:  No CVA tenderness Lungs:  Clear with no wheezes, rales, or rhonchi. HEART:  Regular rate rhythm, no murmurs, no rubs, no clicks Abd:  soft, obese, positive bowel sounds, no organomegally, no rebound, no guarding Ext:  2 plus pulses, no edema, no cyanosis, no clubbing Skin:  No rashes no nodules Neuro:  CN II through XII intact, motor grossly intact   DEVICE  Normal device function.  See PaceArt for details.   Assess/Plan: 1. Chronic systolic heart failure - he is doing very well today with his CHF symptoms class 2. He will continue his current meds. 2. ICD - his St. Jude single chamber device working normally. Will recheck in several months. 3. HTN - his blood pressure is now a little low. He notes that at home it is in the 120 range. Will follow.  Mikle Bosworth.D.

## 2015-11-23 NOTE — Patient Instructions (Addendum)
Medication Instructions:  Your physician recommends that you continue on your current medications as directed. Please refer to the Current Medication list given to you today.  --- If you need a refill on your cardiac medications before your next appointment, please call your pharmacy. ---  Labwork: None ordered  Testing/Procedures: None ordered  Follow-Up: Your physician recommends that you schedule a follow-up appointment in: 3 months with device clinic.  Your physician wants you to follow-up in: 1 year with Dr. Lovena Le.  You will receive a reminder letter in the mail two months in advance. If you don't receive a letter, please call our office to schedule the follow-up appointment.  Thank you for choosing [facility]!!

## 2015-11-26 ENCOUNTER — Encounter (HOSPITAL_COMMUNITY)
Admission: RE | Admit: 2015-11-26 | Discharge: 2015-11-26 | Disposition: A | Discharge status: Home / Self Care | Payer: Medicare Other | Source: Ambulatory Visit | Attending: Cardiology | Admitting: Cardiology

## 2015-11-26 DIAGNOSIS — C61 Malignant neoplasm of prostate: Secondary | ICD-10-CM | POA: Insufficient documentation

## 2015-11-26 MED ORDER — TECHNETIUM TC 99M MEDRONATE IV KIT
25.2000 | PACK | Freq: Once | INTRAVENOUS | Status: AC | PRN
  Administered 2015-11-26: 25.2 via INTRAVENOUS

## 2015-11-27 ENCOUNTER — Telehealth: Payer: Self-pay | Admitting: *Deleted

## 2015-11-27 NOTE — Telephone Encounter (Signed)
Called patient to make him aware that his Merlin transmitter is working and that he does not need to come into the office on 02/24/16 for a device check as we will do it from home on 10/16.  Patient is appreciative and is aware of this change.  He denies questions or concerns at this time.

## 2015-12-08 ENCOUNTER — Ambulatory Visit (INDEPENDENT_AMBULATORY_CARE_PROVIDER_SITE_OTHER): Payer: Medicare Other | Admitting: *Deleted

## 2015-12-08 DIAGNOSIS — Z5181 Encounter for therapeutic drug level monitoring: Secondary | ICD-10-CM

## 2015-12-08 DIAGNOSIS — Z7901 Long term (current) use of anticoagulants: Secondary | ICD-10-CM | POA: Diagnosis not present

## 2015-12-08 DIAGNOSIS — I481 Persistent atrial fibrillation: Secondary | ICD-10-CM | POA: Diagnosis not present

## 2015-12-08 DIAGNOSIS — I4891 Unspecified atrial fibrillation: Secondary | ICD-10-CM | POA: Diagnosis not present

## 2015-12-08 DIAGNOSIS — I4819 Other persistent atrial fibrillation: Secondary | ICD-10-CM

## 2015-12-08 LAB — POCT INR: INR: 5

## 2015-12-15 ENCOUNTER — Ambulatory Visit (INDEPENDENT_AMBULATORY_CARE_PROVIDER_SITE_OTHER): Payer: Medicare Other | Admitting: Pharmacist

## 2015-12-15 DIAGNOSIS — Z5181 Encounter for therapeutic drug level monitoring: Secondary | ICD-10-CM | POA: Diagnosis not present

## 2015-12-15 DIAGNOSIS — Z7901 Long term (current) use of anticoagulants: Secondary | ICD-10-CM | POA: Diagnosis not present

## 2015-12-15 DIAGNOSIS — I4819 Other persistent atrial fibrillation: Secondary | ICD-10-CM

## 2015-12-15 DIAGNOSIS — I481 Persistent atrial fibrillation: Secondary | ICD-10-CM

## 2015-12-15 DIAGNOSIS — I4891 Unspecified atrial fibrillation: Secondary | ICD-10-CM

## 2015-12-15 LAB — POCT INR: INR: 1.7

## 2015-12-21 ENCOUNTER — Ambulatory Visit (INDEPENDENT_AMBULATORY_CARE_PROVIDER_SITE_OTHER)

## 2015-12-21 DIAGNOSIS — I5022 Chronic systolic (congestive) heart failure: Secondary | ICD-10-CM | POA: Diagnosis not present

## 2015-12-21 DIAGNOSIS — Z95810 Presence of automatic (implantable) cardiac defibrillator: Secondary | ICD-10-CM

## 2015-12-21 NOTE — Progress Notes (Signed)
EPIC Encounter for ICM Monitoring  Patient Name: [name] [name] is a 82 y.o. male Date: 12/21/2015 Primary Care Physican: Simona Huh, MD Primary Cardiologist: Aundra Dubin Electrophysiologist: Lovena Le Dry Weight: unknown       1st ICM encounter.   Heart Failure questions reviewed, pt asymptomatic and stated he is feeling fine  Thoracic impedance abnormal suggesting fluid accumulation since 12/05/2015 to 12/21/2015 with only a few days at baseline.  LABS: 11/17/2015 Creatinine 1.31, BUN 22, Potassium 5.0, Sodium 132 10/28/2015 Creatinine 1.32, BUN 18, Potassium 5.2, Sodium 134 10/26/2015 Creatinine 1.06, BUN 21, Potassium 4.8, Sodium 135  Creatinine has ranged from 1.06 to 1.72 since 08/18/2014.    Recommendations:  Advised to follow low sodium diet since this may cause fluid accumulation.  Copy of ICM check sent to Dr Aundra Dubin and Dr Lovena Le for review and recommendations.   Follow-up plan: ICM clinic phone appointment on 12/29/2015.    ICM trend: 12/21/2015       Rosalene Billings, RN 12/21/2015 3:34 PM

## 2015-12-22 NOTE — Progress Notes (Signed)
Increase Lasix to 40 mg daily.  Needs BMET in 10 days and followup soon.

## 2015-12-23 NOTE — Progress Notes (Signed)
Call to patient.  Advised Dr Aundra Dubin has increased Furosemide to 40 mg daily and BMET lab to be drawn in 10 days.  He stated he is now in hospice at home due to bone mets.  He reported nurses are drawing blood at home for coumadin checks.  I advised labs can be drawn by hospice and will need to contact the hospice nurses.  He stated the [facility] number is [phone].  Call to [facility] and spoke with Arbie Cookey.  Requested to have patient's nurse to call due to labs need to be drawn.  She stated the nurse will call back tomorrow.

## 2015-12-24 NOTE — Progress Notes (Signed)
Received voice mail message from hospice nurse, Felicity Coyer, saying she could be reached at [phone].    Call to Felicity Coyer, hospice RN.  Advised Dr Aundra Dubin has given order to have a BMET be drawn in 10 days.  She reported she will draw it and send to [facility] which will be released to Dr Aundra Dubin.  Provided my phone and fax number and to call back if any further information is needed.

## 2015-12-29 ENCOUNTER — Telehealth: Payer: Self-pay | Admitting: Cardiology

## 2015-12-29 ENCOUNTER — Ambulatory Visit (INDEPENDENT_AMBULATORY_CARE_PROVIDER_SITE_OTHER): Payer: Medicare Other | Admitting: Pharmacist

## 2015-12-29 DIAGNOSIS — I481 Persistent atrial fibrillation: Secondary | ICD-10-CM

## 2015-12-29 DIAGNOSIS — Z5181 Encounter for therapeutic drug level monitoring: Secondary | ICD-10-CM

## 2015-12-29 DIAGNOSIS — I4819 Other persistent atrial fibrillation: Secondary | ICD-10-CM

## 2015-12-29 LAB — POCT INR: INR: 3.4

## 2015-12-29 NOTE — Telephone Encounter (Signed)
Spoke with [facility] with Hospice.  INR today was 3.4.  See anti-coag note for details.

## 2015-12-29 NOTE — Telephone Encounter (Signed)
°  New Prob   Calling to give PT-INR results. Please call.

## 2015-12-30 ENCOUNTER — Ambulatory Visit (INDEPENDENT_AMBULATORY_CARE_PROVIDER_SITE_OTHER): Payer: Medicare Other

## 2015-12-30 DIAGNOSIS — I5022 Chronic systolic (congestive) heart failure: Secondary | ICD-10-CM

## 2015-12-30 DIAGNOSIS — Z95810 Presence of automatic (implantable) cardiac defibrillator: Secondary | ICD-10-CM

## 2015-12-30 MED ORDER — FUROSEMIDE 40 MG PO TABS: 40.0000 mg | ORAL_TABLET | Freq: Every day | ORAL | 2 refills | Status: AC

## 2015-12-30 NOTE — Progress Notes (Signed)
EPIC Encounter for ICM Monitoring  Patient Name: [name] [name] is a 82 y.o. male Date: 12/30/2015 Primary Care Physican: Simona Huh, MD Primary Cardiologist: Aundra Dubin Electrophysiologist: Rennis Petty Weight:  unknown       Heart Failure questions reviewed, pt denied fluid symptoms.  He reported he is having a lot of back pain from bone mets.  Hospice care at home continues.  Patient unable to leave home at this time for any office visits.     Thoracic impedance returned to normal after Furosemide increased to 40 mg daily on 12/21/2015.  Marland Kitchen  Recommendations:  Furosemide 40 mg 1 tablet daily was ordered on 12/21/2015 by Dr Aundra Dubin but order was not updated in chart at that time.  Order updated today and patient continues Furosemide 40 mg 1 tablet daily.   BMET will be drawn by Hospice nurse 12/31/2015.    Follow-up plan: ICM clinic phone appointment on 02/02/2016.  Copy of ICM check sent to primary cardiologist and device physician.   ICM trend: 12/30/2015        Rosalene Billings, RN 12/30/2015 11:44 AM

## 2016-01-05 ENCOUNTER — Encounter: Payer: Self-pay | Admitting: Cardiology

## 2016-01-05 ENCOUNTER — Ambulatory Visit (INDEPENDENT_AMBULATORY_CARE_PROVIDER_SITE_OTHER): Payer: Medicare Other | Admitting: Interventional Cardiology

## 2016-01-05 DIAGNOSIS — I481 Persistent atrial fibrillation: Secondary | ICD-10-CM

## 2016-01-05 DIAGNOSIS — I4819 Other persistent atrial fibrillation: Secondary | ICD-10-CM

## 2016-01-05 DIAGNOSIS — Z5181 Encounter for therapeutic drug level monitoring: Secondary | ICD-10-CM

## 2016-01-05 LAB — POCT INR: INR: 3.5

## 2016-01-12 ENCOUNTER — Ambulatory Visit (INDEPENDENT_AMBULATORY_CARE_PROVIDER_SITE_OTHER): Admitting: Cardiology

## 2016-01-12 DIAGNOSIS — I4819 Other persistent atrial fibrillation: Secondary | ICD-10-CM

## 2016-01-12 DIAGNOSIS — Z5181 Encounter for therapeutic drug level monitoring: Secondary | ICD-10-CM

## 2016-01-12 DIAGNOSIS — I481 Persistent atrial fibrillation: Secondary | ICD-10-CM

## 2016-01-12 LAB — POCT INR: INR: 5.5

## 2016-01-12 MED ORDER — WARFARIN SODIUM 2.5 MG PO TABS: ORAL_TABLET | ORAL | 3 refills | Status: AC

## 2016-01-13 ENCOUNTER — Telehealth: Payer: Self-pay | Admitting: Cardiology

## 2016-01-13 ENCOUNTER — Telehealth: Payer: Self-pay

## 2016-01-13 NOTE — Telephone Encounter (Signed)
New Message:    Pt have decided not to go to any more doctor appointments. He also wants to take the least amount of medicine that Dr Aundra Dubin will allow and he is considering turning off his defibrillator.He wants to keep the pacemaker on if possible.

## 2016-01-13 NOTE — Telephone Encounter (Signed)
Returned call to hospice nurse, Stanton Kidney, [phone]) as requested by voice mail.  She stated patient wants to decrease the medication regimen as well as turn off the defibrillator but would like for the pacemaker to continue.  She reported he stays very nauseated and not able to eat.  Advised the message has already been sent this morning to Dr Aundra Dubin and his staff as well as Dr Lovena Le.  Advised hospice will be notified of recommendations.  She stated that would be fine.  She can be reached at [phone]

## 2016-01-13 NOTE — Telephone Encounter (Signed)
Since he is on hospice now, can stop atorvastatin and spironolactone.  Probably reasonable to stop warfarin as well.  May want to continue Tikosyn as he would likely feel worse if he went into atrial fibrillation.  If Tikosyn is continued, will need to have BMETs done periodically.

## 2016-01-18 ENCOUNTER — Telehealth: Payer: Self-pay | Admitting: *Deleted

## 2016-01-18 ENCOUNTER — Ambulatory Visit: Payer: Self-pay | Admitting: *Deleted

## 2016-01-18 DIAGNOSIS — I4819 Other persistent atrial fibrillation: Secondary | ICD-10-CM

## 2016-01-18 DIAGNOSIS — Z5181 Encounter for therapeutic drug level monitoring: Secondary | ICD-10-CM

## 2016-01-18 NOTE — Telephone Encounter (Signed)
[name] [name] is calling because patient would like to discontinue all medications and turn defibrillator  off . He would like to keep the pacemaker and stay on the Tikosyn , Please call   Thanks

## 2016-01-18 NOTE — Telephone Encounter (Signed)
Spoke w/Mary [phone]) gave Dr Claris Gladden recommendations, she will notify pt.  Advised Dr Tanna Furry office would be getting back in touch regarding the defib.

## 2016-01-18 NOTE — Telephone Encounter (Signed)
Violeta Gelinas NP with Hospice called to inform us that the pt's Coumadin/Warfain today by Dr. Lovena Le.  No other details given and at this time the pt remains at Hospice.

## 2016-01-25 ENCOUNTER — Encounter: Payer: Self-pay | Admitting: Internal Medicine

## 2016-01-25 ENCOUNTER — Telehealth: Payer: Self-pay | Admitting: Internal Medicine

## 2016-01-25 NOTE — Telephone Encounter (Signed)
[name] [name] says that Hospice physician would like Dr. Tanna Furry "OK" to deactivate ICD due to Mr. Jose being at end-of-life. VO from Dr. Lovena Le obtained to deactivate ICD. St. Jude rep aware, Stanton Kidney has [employer] number just in case.

## 2016-01-25 NOTE — Telephone Encounter (Signed)
New message      Pt is at [facility]. Calling to have someone turn off his device as soon as possible.

## 2016-01-27 ENCOUNTER — Encounter: Payer: Self-pay | Admitting: Internal Medicine

## 2016-02-07 DEATH — deceased

## 2016-02-17 NOTE — Progress Notes (Signed)
No longer followed in ICM clinic due to patient is in hospice.

## 2016-02-22 ENCOUNTER — Encounter: Payer: Medicare Other | Admitting: *Deleted

## 2016-02-22 ENCOUNTER — Telehealth: Payer: Self-pay | Admitting: Cardiology

## 2016-02-22 NOTE — Telephone Encounter (Signed)
Attempted to confirm remote transmission with pt. No answer and was unable to leave a message.   

## 2016-02-26 ENCOUNTER — Encounter: Payer: Self-pay | Admitting: Cardiology

## 2016-07-29 ENCOUNTER — Encounter: Payer: Self-pay | Admitting: Cardiology

## 2017-03-09 IMAGING — NM NM BONE WHOLE BODY
4 series · 4 of 4 positions shown · non-contrast
Comparison: No recent prior.

CLINICAL DATA: Elevated PSA.  Prostate cancer.

EXAM:
NUCLEAR MEDICINE WHOLE BODY BONE SCAN
TECHNIQUE: Whole body anterior and posterior images were obtained approximately
3 hours after intravenous injection of radiopharmaceutical.
RADIOPHARMACEUTICALS:  25.2 mCi Wechnetium-77m MDP IV

[Series 1: wbr_bone_60 whole body · 2.66mm/px · 1 of 1 slices shown (1 of 2)]
[im 1/1]
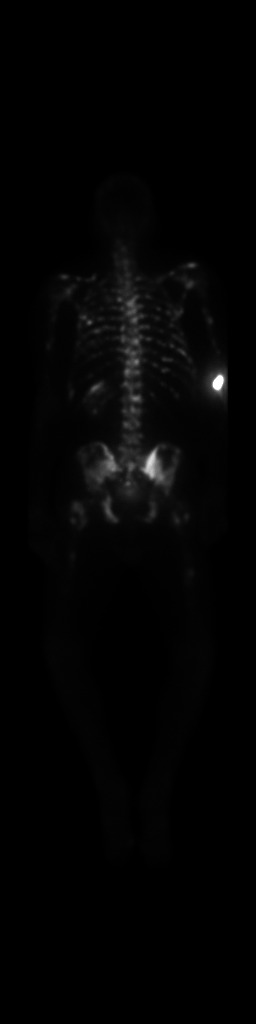

[Series 1: whole body · 2.66mm/px · 1 of 1 slices shown (1 of 2)]
[im 1/1]
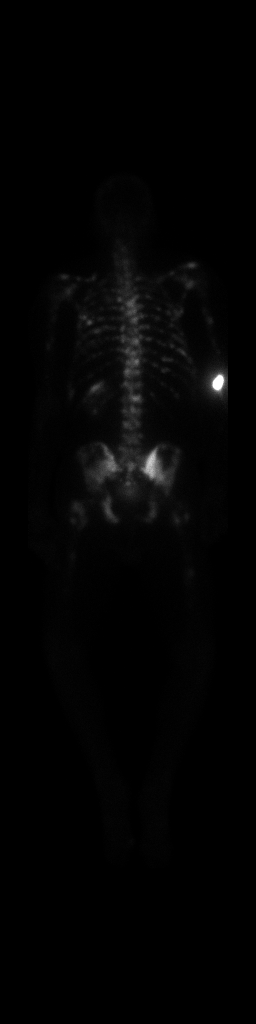

[Series 1: wbr_bone_60 whole body · 2.66mm/px · 1 of 1 slices shown (2 of 2)]
[im 1/1]
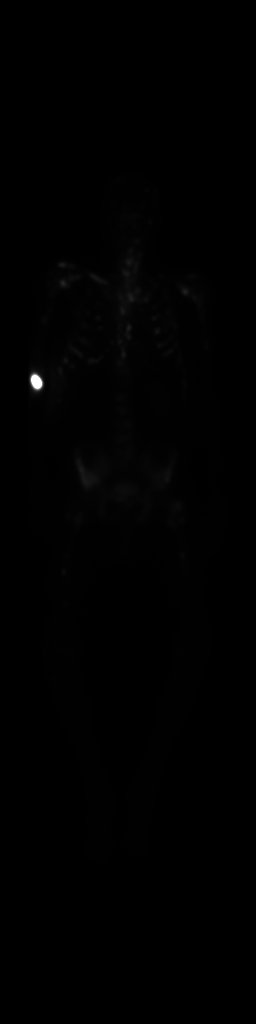

[Series 1: whole body · 2.66mm/px · 1 of 1 slices shown (2 of 2)]
[im 1/1]
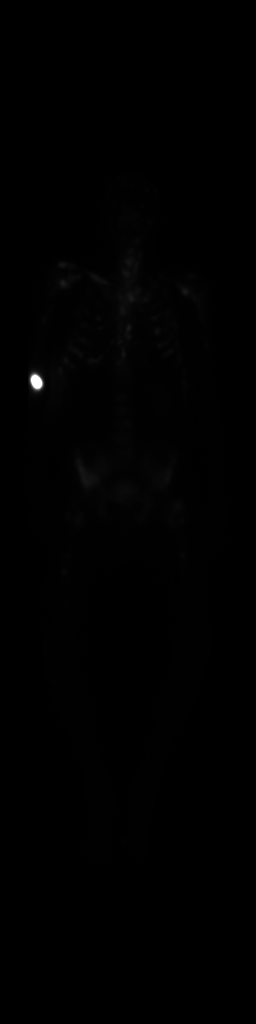

[4 of 4 positions shown; findings below may reference images not displayed]

FINDINGS: Right oophorectomy. Left renal function and excretion. Innumerable
bony lesions are noted throughout the skull, entire spine, ribs,
pelvis, right clavicle, humeri, femurs. Findings consist with
widespread metastatic disease .
IMPRESSION: Widespread metastatic disease.

## 2017-11-07 ENCOUNTER — Encounter: Payer: Self-pay | Admitting: Cardiology
